# Patient Record
Sex: Male | Born: 1965
Health system: Southern US, Community
[De-identification: ages and names within clinical notes are randomized; demographics above are authoritative.]

## PROBLEM LIST (undated history)

## (undated) DIAGNOSIS — K219 Gastro-esophageal reflux disease without esophagitis: Secondary | ICD-10-CM

## (undated) DIAGNOSIS — D571 Sickle-cell disease without crisis: Secondary | ICD-10-CM

## (undated) DIAGNOSIS — T7840XA Allergy, unspecified, initial encounter: Secondary | ICD-10-CM

## (undated) DIAGNOSIS — B019 Varicella without complication: Secondary | ICD-10-CM

## (undated) HISTORY — DX: Varicella without complication: B01.9

## (undated) HISTORY — DX: Allergy, unspecified, initial encounter: T78.40XA

## (undated) HISTORY — PX: WISDOM TOOTH EXTRACTION: SHX21

---
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
## 2015-11-14 ENCOUNTER — Ambulatory Visit (INDEPENDENT_AMBULATORY_CARE_PROVIDER_SITE_OTHER): Payer: BLUE CROSS/BLUE SHIELD | Admitting: Internal Medicine

## 2015-11-14 ENCOUNTER — Encounter: Payer: Self-pay | Admitting: Internal Medicine

## 2015-11-14 VITALS — BP 138/82 | HR 84 | Temp 98.2°F | Ht 67.25 in | Wt 211.5 lb

## 2015-11-14 DIAGNOSIS — Z0000 Encounter for general adult medical examination without abnormal findings: Secondary | ICD-10-CM | POA: Diagnosis not present

## 2015-11-14 DIAGNOSIS — Z23 Encounter for immunization: Secondary | ICD-10-CM | POA: Diagnosis not present

## 2015-11-14 DIAGNOSIS — R7989 Other specified abnormal findings of blood chemistry: Secondary | ICD-10-CM

## 2015-11-14 NOTE — Progress Notes (Signed)
Pre visit review using our clinic review tool, if applicable. No additional management support is needed unless otherwise documented below in the visit note. 

## 2015-11-14 NOTE — Progress Notes (Signed)
HPI  Pt presents to the clinic today to establish care. He has not had a PCP in many years.  He would like his physical exam today if he can get it.  Flu: 09/2015 Tetanus: unsure Vision Screening: yearly Dentist: biannually  Diet: He does eat meat. He consumes fruits and veggies daily. He does eat some fried foods. He drinks mostly water. Exercise: None  Past Medical History  Diagnosis Date  . Chicken pox     No current outpatient prescriptions on file.   No current facility-administered medications for this visit.    No Known Allergies  Family History  Problem Relation Age of Onset  . Breast cancer Mother   . Hypertension Mother   . Colon cancer Maternal Uncle   . Breast cancer Maternal Grandmother   . Arthritis Paternal Grandmother   . Hypertension Paternal Grandmother     Social History   Social History  . Marital Status: Married    Spouse Name: N/A  . Number of Children: N/A  . Years of Education: N/A   Occupational History  . Not on file.   Social History Main Topics  . Smoking status: Never Smoker   . Smokeless tobacco: Never Used  . Alcohol Use: 0.0 oz/week    0 Standard drinks or equivalent per week     Comment: occasional-social  . Drug Use: No  . Sexual Activity: Not on file   Other Topics Concern  . Not on file   Social History Narrative  . No narrative on file    ROS:  Constitutional: Denies fever, malaise, fatigue, headache or abrupt weight changes.  HEENT: Denies eye pain, eye redness, ear pain, ringing in the ears, wax buildup, runny nose, nasal congestion, bloody nose, or sore throat. Respiratory: Denies difficulty breathing, shortness of breath, cough or sputum production.   Cardiovascular: Denies chest pain, chest tightness, palpitations or swelling in the hands or feet.  Gastrointestinal: Pt reports external hemorrhoids. Denies abdominal pain, bloating, constipation, diarrhea or blood in the stool.  GU: Denies frequency, urgency,  pain with urination, blood in urine, odor or discharge. Musculoskeletal: Denies decrease in range of motion, difficulty with gait, muscle pain or joint pain and swelling.  Skin: Denies redness, rashes, lesions or ulcercations.  Neurological: Denies dizziness, difficulty with memory, difficulty with speech or problems with balance and coordination.  Psych: Denies anxiety, depression, SI/HI.  No other specific complaints in a complete review of systems (except as listed in HPI above).  PE:  BP 138/82 mmHg  Pulse 84  Temp(Src) 98.2 F (36.8 C) (Oral)  Ht 5' 7.25" (1.708 m)  Wt 211 lb 8 oz (95.936 kg)  BMI 32.89 kg/m2  SpO2 98% Wt Readings from Last 3 Encounters:  11/14/15 211 lb 8 oz (95.936 kg)    General: Appears his stated age, obese in NAD. HEENT: Head: normal shape and size; Eyes: sclera white, no icterus, conjunctiva pink, PERRLA and EOMs intact; Ears: Tm's gray and intact, normal light reflex;Throat/Mouth: Teeth present, mucosa pink and moist, no lesions or ulcerations noted.  Neck: Neck supple, trachea midline. No masses, lumps or thyromegaly present.  Cardiovascular: Normal rate and rhythm. S1,S2 noted.  No murmur, rubs or gallops noted. No BLE edema. No carotid bruits noted. Pulmonary/Chest: Normal effort and positive vesicular breath sounds. No respiratory distress. No wheezes, rales or ronchi noted.  Abdomen: Soft and nontender. Normal bowel sounds. No distention or masses noted. Liver, spleen and kidneys non palpable. Musculoskeletal: Normal range of motion. Strength  5/5 BUE/BLE. No signs of joint swelling. No difficulty with gait.  Neurological: Alert and oriented.Cranial nerves II-XII grossly intact. Coordination normal.  Psychiatric: Mood and affect normal. Behavior is normal. Judgment and thought content normal.     Assessment and Plan:  Preventative Health Maintenance:  Flu shot UTD Tdap today Encouraged him to see a eye doctor and dentist annually Will check  CBC, CMET, Lipid, A1C and HIV today Encouraged him to consume a balanced diet and start an exercise regimen  RTC in 1 year or sooner if needed

## 2015-11-14 NOTE — Addendum Note (Signed)
Addended by: Lurlean Nanny on: 11/14/2015 04:38 PM   Modules accepted: Orders

## 2015-11-14 NOTE — Patient Instructions (Signed)

## 2015-11-15 LAB — COMPREHENSIVE METABOLIC PANEL
ALBUMIN: 4.5 g/dL (ref 3.5–5.2)
ALT: 40 U/L (ref 0–53)
AST: 32 U/L (ref 0–37)
Alkaline Phosphatase: 76 U/L (ref 39–117)
BILIRUBIN TOTAL: 0.3 mg/dL (ref 0.2–1.2)
BUN: 15 mg/dL (ref 6–23)
CALCIUM: 9.5 mg/dL (ref 8.4–10.5)
CO2: 26 meq/L (ref 19–32)
CREATININE: 1.42 mg/dL (ref 0.40–1.50)
Chloride: 105 mEq/L (ref 96–112)
GFR: 68.27 mL/min (ref >60.00)
Glucose, Bld: 83 mg/dL (ref 70–99)
Potassium: 4.1 mEq/L (ref 3.5–5.1)
Sodium: 140 mEq/L (ref 135–145)
Total Protein: 7.4 g/dL (ref 6.0–8.3)

## 2015-11-15 LAB — LDL CHOLESTEROL, DIRECT: LDL DIRECT: 108 mg/dL

## 2015-11-15 LAB — CBC
HCT: 43.7 % (ref 39.0–52.0)
HEMOGLOBIN: 14.6 g/dL (ref 13.0–17.0)
MCHC: 33.3 g/dL (ref 30.0–36.0)
MCV: 95.7 fl (ref 78.0–100.0)
PLATELETS: 238 10*3/uL (ref 150.0–400.0)
RBC: 4.57 Mil/uL (ref 4.22–5.81)
RDW: 12.6 % (ref 11.5–15.5)
WBC: 7.8 10*3/uL (ref 4.0–10.5)

## 2015-11-15 LAB — LIPID PANEL
CHOLESTEROL: 182 mg/dL (ref 0–200)
HDL: 47.1 mg/dL (ref >39.00)
NonHDL: 135.12
TRIGLYCERIDES: 216 mg/dL — AB (ref 0.0–149.0)
Total CHOL/HDL Ratio: 4
VLDL: 43.2 mg/dL — ABNORMAL HIGH (ref 0.0–40.0)

## 2015-11-15 LAB — HIV ANTIBODY (ROUTINE TESTING W REFLEX): HIV 1&2 Ab, 4th Generation: NONREACTIVE

## 2015-11-15 LAB — HEMOGLOBIN A1C: Hgb A1c MFr Bld: 4.6 % (ref 4.6–6.5)

## 2016-11-26 ENCOUNTER — Ambulatory Visit (INDEPENDENT_AMBULATORY_CARE_PROVIDER_SITE_OTHER): Payer: BLUE CROSS/BLUE SHIELD | Admitting: Internal Medicine

## 2016-11-26 ENCOUNTER — Encounter: Payer: Self-pay | Admitting: Internal Medicine

## 2016-11-26 VITALS — BP 128/84 | HR 79 | Temp 98.6°F | Ht 67.0 in | Wt 216.0 lb

## 2016-11-26 DIAGNOSIS — Z0000 Encounter for general adult medical examination without abnormal findings: Secondary | ICD-10-CM

## 2016-11-26 DIAGNOSIS — Z1211 Encounter for screening for malignant neoplasm of colon: Secondary | ICD-10-CM | POA: Diagnosis not present

## 2016-11-26 DIAGNOSIS — K219 Gastro-esophageal reflux disease without esophagitis: Secondary | ICD-10-CM | POA: Diagnosis not present

## 2016-11-26 LAB — COMPREHENSIVE METABOLIC PANEL
ALBUMIN: 4.7 g/dL (ref 3.5–5.2)
ALK PHOS: 77 U/L (ref 39–117)
ALT: 45 U/L (ref 0–53)
AST: 31 U/L (ref 0–37)
BILIRUBIN TOTAL: 1 mg/dL (ref 0.2–1.2)
BUN: 14 mg/dL (ref 6–23)
CO2: 30 mEq/L (ref 19–32)
Calcium: 9.7 mg/dL (ref 8.4–10.5)
Chloride: 104 mEq/L (ref 96–112)
Creatinine, Ser: 1.28 mg/dL (ref 0.40–1.50)
GFR: 76.63 mL/min (ref >60.00)
GLUCOSE: 87 mg/dL (ref 70–99)
Potassium: 3.9 mEq/L (ref 3.5–5.1)
Sodium: 140 mEq/L (ref 135–145)
TOTAL PROTEIN: 8.1 g/dL (ref 6.0–8.3)

## 2016-11-26 LAB — CBC
HCT: 44.6 % (ref 39.0–52.0)
HEMOGLOBIN: 15.3 g/dL (ref 13.0–17.0)
MCHC: 34.4 g/dL (ref 30.0–36.0)
MCV: 94.5 fl (ref 78.0–100.0)
Platelets: 239 10*3/uL (ref 150.0–400.0)
RBC: 4.72 Mil/uL (ref 4.22–5.81)
RDW: 13 % (ref 11.5–15.5)
WBC: 7.2 10*3/uL (ref 4.0–10.5)

## 2016-11-26 LAB — LIPID PANEL
CHOLESTEROL: 216 mg/dL — AB (ref 0–200)
HDL: 56.5 mg/dL (ref >39.00)
LDL Cholesterol: 138 mg/dL — ABNORMAL HIGH (ref 0–99)
NONHDL: 159.39
Total CHOL/HDL Ratio: 4
Triglycerides: 105 mg/dL (ref 0.0–149.0)
VLDL: 21 mg/dL (ref 0.0–40.0)

## 2016-11-26 MED ORDER — OMEPRAZOLE 20 MG PO CPDR: 20.0000 mg | DELAYED_RELEASE_CAPSULE | Freq: Every day | ORAL | 3 refills | Status: DC

## 2016-11-26 NOTE — Patient Instructions (Signed)

## 2016-11-26 NOTE — Assessment & Plan Note (Signed)
Triggered by tomato based and spicy food eRx for Prilosec OTC

## 2016-11-26 NOTE — Progress Notes (Signed)
Subjective:    Patient ID: Victor Carter, male    DOB: Oct 16, 1966, 50 y.o.   MRN: [id number]  HPI  Pt presents to the clinic today for his annual exam.  Flu: 09/2016 Tetanus: 11/2015 Vision Screening: yearly Dentist: biannually  Diet: He does eat meat. He consumes fruits and veggies most days. He does eat some fried foods. He drinks mostly water, some soda or beer. Exercise: None  Review of Systems  Past Medical History:  Diagnosis Date  . Chicken pox     No current outpatient prescriptions on file.   No current facility-administered medications for this visit.     No Known Allergies  Family History  Problem Relation Age of Onset  . Breast cancer Mother   . Hypertension Mother   . Colon cancer Maternal Uncle   . Breast cancer Maternal Grandmother   . Arthritis Paternal Grandmother   . Hypertension Paternal Grandmother   . Diabetes Neg Hx   . Stroke Neg Hx     Social History   Social History  . Marital status: Married    Spouse name: N/A  . Number of children: N/A  . Years of education: N/A   Occupational History  . Not on file.   Social History Main Topics  . Smoking status: Never Smoker  . Smokeless tobacco: Never Used  . Alcohol use 0.0 oz/week     Comment: occasional-social  . Drug use: No  . Sexual activity: Yes   Other Topics Concern  . Not on file   Social History Narrative  . No narrative on file     Constitutional: Denies fever, malaise, fatigue, headache or abrupt weight changes.  HEENT: Denies eye pain, eye redness, ear pain, ringing in the ears, wax buildup, runny nose, nasal congestion, bloody nose, or sore throat. Respiratory: Denies difficulty breathing, shortness of breath, cough or sputum production.   Cardiovascular: Denies chest pain, chest tightness, palpitations or swelling in the hands or feet.  Gastrointestinal: Pt reports reflux. Denies abdominal pain, bloating, constipation, diarrhea or blood in the stool.  GU:  Denies urgency, frequency, pain with urination, burning sensation, blood in urine, odor or discharge. Musculoskeletal: Denies decrease in range of motion, difficulty with gait, muscle pain or joint pain and swelling.  Skin: Denies redness, rashes, lesions or ulcercations.  Neurological: Denies dizziness, difficulty with memory, difficulty with speech or problems with balance and coordination.  Psych: Denies anxiety, depression, SI/HI.  No other specific complaints in a complete review of systems (except as listed in HPI above).     Objective:   Physical Exam   BP 128/84   Pulse 79   Temp 98.6 F (37 C) (Oral)   Ht 5\' 7"  (1.702 m)   Wt 216 lb (98 kg) Comment: w/ boots  SpO2 98%   BMI 33.83 kg/m  Wt Readings from Last 3 Encounters:  11/26/16 216 lb (98 kg)  11/14/15 211 lb 8 oz (95.9 kg)    General: Appears his stated age, obese in NAD. Skin: Warm, dry and intact.  HEENT: Head: normal shape and size; Eyes: sclera white, no icterus, conjunctiva pink, PERRLA and EOMs intact; Ears: Tm's gray and intact, normal light reflex; Throat/Mouth: Teeth present, mucosa pink and moist, no exudate, lesions or ulcerations noted.  Neck:  Neck supple, trachea midline. No masses, lumps or thyromegaly present.  Cardiovascular: Normal rate and rhythm. S1,S2 noted.  No murmur, rubs or gallops noted. No JVD or BLE edema. No carotid bruits noted. Pulmonary/Chest:  Normal effort and positive vesicular breath sounds. No respiratory distress. No wheezes, rales or ronchi noted.  Abdomen: Soft and nontender. Normal bowel sounds. No distention or masses noted. Liver, spleen and kidneys non palpable. Musculoskeletal: Normal range of motion. Strength 5/5 BUE/BLE. No difficulty with gait.  Neurological: Alert and oriented. Cranial nerves II-XII grossly intact. Coordination normal.  Psychiatric: Mood and affect normal. Behavior is normal. Judgment and thought content normal.     BMET    Component Value  Date/Time   NA 140 11/14/2015 1545   K 4.1 11/14/2015 1545   CL 105 11/14/2015 1545   CO2 26 11/14/2015 1545   GLUCOSE 83 11/14/2015 1545   BUN 15 11/14/2015 1545   CREATININE 1.42 11/14/2015 1545   CALCIUM 9.5 11/14/2015 1545    Lipid Panel     Component Value Date/Time   CHOL 182 11/14/2015 1545   TRIG 216.0 (H) 11/14/2015 1545   HDL 47.10 11/14/2015 1545   CHOLHDL 4 11/14/2015 1545   VLDL 43.2 (H) 11/14/2015 1545    CBC    Component Value Date/Time   WBC 7.8 11/14/2015 1545   RBC 4.57 11/14/2015 1545   HGB 14.6 11/14/2015 1545   HCT 43.7 11/14/2015 1545   PLT 238.0 11/14/2015 1545   MCV 95.7 11/14/2015 1545   MCHC 33.3 11/14/2015 1545   RDW 12.6 11/14/2015 1545    Hgb A1C Lab Results  Component Value Date   HGBA1C 4.6 11/14/2015           Assessment & Plan:   Preventative Health Maintenance:  Flu and tetanus UTD Referral placed to GI for screening colonoscopy Encouraged him to consume a balanced diet and exercise regimen Advised him to see an eye doctor and dentist annaully Will check CBC, CMET, Lipid profile today  RTC in 1 year, sooner if needed Webb Silversmith, NP

## 2016-12-23 ENCOUNTER — Other Ambulatory Visit: Payer: Self-pay

## 2016-12-23 ENCOUNTER — Telehealth: Payer: Self-pay

## 2016-12-23 NOTE — Telephone Encounter (Signed)
Gastroenterology Pre-Procedure Review  Request Date:  Requesting Physician: Dr.   PATIENT REVIEW QUESTIONS: The patient responded to the following health history questions as indicated:    1. Are you having any GI issues? no 2. Do you have a personal history of Polyps? no 3. Do you have a family history of Colon Cancer or Polyps? yes (Aunts and uncles) 4. Diabetes Mellitus? no 5. Joint replacements in the past 12 months?no 6. Major health problems in the past 3 months?no 7. Any artificial heart valves, MVP, or defibrillator?no    MEDICATIONS & ALLERGIES:    Patient reports the following regarding taking any anticoagulation/antiplatelet therapy:   Plavix, Coumadin, Eliquis, Xarelto, Lovenox, Pradaxa, Brilinta, or Effient? no Aspirin? no  Patient confirms/reports the following medications:  Current Outpatient Prescriptions  Medication Sig Dispense Refill  . omeprazole (PRILOSEC) 20 MG capsule Take 1 capsule (20 mg total) by mouth daily. 30 capsule 3   No current facility-administered medications for this visit.     Patient confirms/reports the following allergies:  No Known Allergies  No orders of the defined types were placed in this encounter.   AUTHORIZATION INFORMATION Primary Insurance: 1D#: Group #:  Secondary Insurance: 1D#: Group #:  SCHEDULE INFORMATION: Date: 01/15/17 Time: Location: [facility]

## 2017-01-14 ENCOUNTER — Encounter: Payer: Self-pay | Admitting: *Deleted

## 2017-01-15 ENCOUNTER — Ambulatory Visit: Payer: BLUE CROSS/BLUE SHIELD | Admitting: Anesthesiology

## 2017-01-15 ENCOUNTER — Encounter: Payer: Self-pay | Admitting: *Deleted

## 2017-01-15 ENCOUNTER — Encounter
Admission: RE | Disposition: A | Discharge status: Home / Self Care | Payer: Self-pay | Source: Ambulatory Visit | Attending: Gastroenterology

## 2017-01-15 ENCOUNTER — Ambulatory Visit
Admission: RE | Admit: 2017-01-15 | Discharge: 2017-01-15 | Disposition: A | Discharge status: Home / Self Care | Payer: BLUE CROSS/BLUE SHIELD | Source: Ambulatory Visit | Attending: Gastroenterology | Admitting: Gastroenterology

## 2017-01-15 DIAGNOSIS — K640 First degree hemorrhoids: Secondary | ICD-10-CM | POA: Diagnosis not present

## 2017-01-15 DIAGNOSIS — Z800 Family history of malignant neoplasm of digestive organs: Secondary | ICD-10-CM | POA: Insufficient documentation

## 2017-01-15 DIAGNOSIS — K649 Unspecified hemorrhoids: Secondary | ICD-10-CM | POA: Diagnosis not present

## 2017-01-15 DIAGNOSIS — Z8371 Family history of colonic polyps: Secondary | ICD-10-CM | POA: Insufficient documentation

## 2017-01-15 DIAGNOSIS — K219 Gastro-esophageal reflux disease without esophagitis: Secondary | ICD-10-CM | POA: Diagnosis not present

## 2017-01-15 DIAGNOSIS — Z9889 Other specified postprocedural states: Secondary | ICD-10-CM | POA: Insufficient documentation

## 2017-01-15 DIAGNOSIS — Z79899 Other long term (current) drug therapy: Secondary | ICD-10-CM | POA: Diagnosis not present

## 2017-01-15 DIAGNOSIS — D123 Benign neoplasm of transverse colon: Secondary | ICD-10-CM | POA: Diagnosis not present

## 2017-01-15 DIAGNOSIS — K635 Polyp of colon: Secondary | ICD-10-CM | POA: Diagnosis not present

## 2017-01-15 DIAGNOSIS — D125 Benign neoplasm of sigmoid colon: Secondary | ICD-10-CM | POA: Diagnosis not present

## 2017-01-15 DIAGNOSIS — Z8249 Family history of ischemic heart disease and other diseases of the circulatory system: Secondary | ICD-10-CM | POA: Insufficient documentation

## 2017-01-15 DIAGNOSIS — Z1211 Encounter for screening for malignant neoplasm of colon: Secondary | ICD-10-CM | POA: Insufficient documentation

## 2017-01-15 DIAGNOSIS — Z803 Family history of malignant neoplasm of breast: Secondary | ICD-10-CM | POA: Diagnosis not present

## 2017-01-15 DIAGNOSIS — Z8619 Personal history of other infectious and parasitic diseases: Secondary | ICD-10-CM | POA: Diagnosis not present

## 2017-01-15 DIAGNOSIS — Z8261 Family history of arthritis: Secondary | ICD-10-CM | POA: Insufficient documentation

## 2017-01-15 HISTORY — DX: Gastro-esophageal reflux disease without esophagitis: K21.9

## 2017-01-15 HISTORY — PX: COLONOSCOPY WITH PROPOFOL: SHX5780

## 2017-01-15 SURGERY — COLONOSCOPY WITH PROPOFOL
Anesthesia: General

## 2017-01-15 MED ORDER — MIDAZOLAM HCL 5 MG/5ML IJ SOLN
INTRAMUSCULAR | Status: DC | PRN
  Administered 2017-01-15: 1 mg via INTRAVENOUS

## 2017-01-15 MED ORDER — SODIUM CHLORIDE 0.9 % IV SOLN
INTRAVENOUS | Status: DC
  Administered 2017-01-15: 08:00:00 via INTRAVENOUS

## 2017-01-15 MED ORDER — FENTANYL CITRATE (PF) 100 MCG/2ML IJ SOLN
INTRAMUSCULAR | Status: AC
  Filled 2017-01-15: qty 2

## 2017-01-15 MED ORDER — PROPOFOL 500 MG/50ML IV EMUL
INTRAVENOUS | Status: DC | PRN
  Administered 2017-01-15: 120 ug/kg/min via INTRAVENOUS

## 2017-01-15 MED ORDER — PROPOFOL 10 MG/ML IV BOLUS
INTRAVENOUS | Status: DC | PRN
  Administered 2017-01-15: 46.1 mg via INTRAVENOUS

## 2017-01-15 MED ORDER — MIDAZOLAM HCL 2 MG/2ML IJ SOLN
INTRAMUSCULAR | Status: AC
  Filled 2017-01-15: qty 2

## 2017-01-15 MED ORDER — FENTANYL CITRATE (PF) 100 MCG/2ML IJ SOLN
INTRAMUSCULAR | Status: DC | PRN
  Administered 2017-01-15: 50 ug via INTRAVENOUS

## 2017-01-15 MED ORDER — LIDOCAINE HCL (PF) 2 % IJ SOLN
INTRAMUSCULAR | Status: AC
  Filled 2017-01-15: qty 2

## 2017-01-15 MED ORDER — PROPOFOL 500 MG/50ML IV EMUL
INTRAVENOUS | Status: AC
  Filled 2017-01-15: qty 50

## 2017-01-15 NOTE — Anesthesia Postprocedure Evaluation (Signed)
Anesthesia Post Note  Patient: Victor Carter  Procedure(s) Performed: Procedure(s) (LRB): COLONOSCOPY WITH PROPOFOL (N/A)  Patient location during evaluation: Endoscopy Anesthesia Type: General Level of consciousness: awake and alert Pain management: pain level controlled Vital Signs Assessment: post-procedure vital signs reviewed and stable Respiratory status: spontaneous breathing and respiratory function stable Cardiovascular status: stable Anesthetic complications: no     Last Vitals:  Vitals:   01/15/17 0730 01/15/17 0817  BP: 129/88   Pulse: 74   Resp: 17   Temp: 36.4 C (!) 35.8 C    Last Pain:  Vitals:   01/15/17 0817  TempSrc: Tympanic                 Keaten Mashek K

## 2017-01-15 NOTE — H&P (Signed)
  Jonathon Bellows MD [address], [place],  [address] Phone: [phone] Fax : [phone]  Primary Care Physician:  Webb Silversmith, NP Primary Gastroenterologist:  Dr. Jonathon Bellows   Pre-Procedure History & Physical: HPI:  Victor Carter is a 51 y.o. male is here for an colonoscopy.   Past Medical History:  Diagnosis Date  . Chicken pox   . GERD (gastroesophageal reflux disease)     Past Surgical History:  Procedure Laterality Date  . LEG TENDON SURGERY Left 1986  . WISDOM TOOTH EXTRACTION      Prior to Admission medications   Medication Sig Start Date End Date Taking? Authorizing Provider  omeprazole (PRILOSEC) 20 MG capsule Take 1 capsule (20 mg total) by mouth daily. 11/26/16  Yes Jearld Fenton, NP    Allergies as of 12/23/2016  . (No Known Allergies)    Family History  Problem Relation Age of Onset  . Breast cancer Mother   . Hypertension Mother   . Breast cancer Maternal Grandmother   . Colon cancer Maternal Uncle   . Arthritis Paternal Grandmother   . Hypertension Paternal Grandmother   . Diabetes Neg Hx   . Stroke Neg Hx     Social History   Social History  . Marital status: Married    Spouse name: N/A  . Number of children: N/A  . Years of education: N/A   Occupational History  . Not on file.   Social History Main Topics  . Smoking status: Never Smoker  . Smokeless tobacco: Never Used  . Alcohol use 0.0 oz/week     Comment: occasional-social  . Drug use: No  . Sexual activity: Yes   Other Topics Concern  . Not on file   Social History Narrative  . No narrative on file    Review of Systems: See HPI, otherwise negative ROS  Physical Exam: BP 129/88   Pulse 74   Temp 97.6 F (36.4 C) (Tympanic)   Resp 17   Ht 5\' 7"  (1.702 m)   Wt 203 lb (92.1 kg)   BMI 31.79 kg/m  General:   Alert,  pleasant and cooperative in NAD Head:  Normocephalic and atraumatic. Neck:  Supple; no masses or thyromegaly. Lungs:  Clear  throughout to auscultation.    Heart:  Regular rate and rhythm. Abdomen:  Soft, nontender and nondistended. Normal bowel sounds, without guarding, and without rebound.   Neurologic:  Alert and  oriented x4;  grossly normal neurologically.  Impression/Plan: Victor Carter is here for an colonoscopy to be performed for colorectal cancer screening- father had colon polyps  Risks, benefits, limitations, and alternatives regarding  colonoscopy have been reviewed with the patient.  Questions have been answered.  All parties agreeable.   Jonathon Bellows, MD  01/15/2017, 7:50 AM

## 2017-01-15 NOTE — Op Note (Signed)
[facility] Gastroenterology Patient Name: Victor Carter Procedure Date: 01/15/2017 7:52 AM MRN: [id number] Account #: [id number] Date of Birth: [birth year] Admit Type: Outpatient Age: 51 Room: [facility] ENDO ROOM 1 Gender: Male Note Status: Finalized Procedure:            Colonoscopy Indications:          Colon cancer screening in patient at increased risk:                        Family history of 1st-degree relative with colon polyps Providers:            Jonathon Bellows MD, MD Referring MD:         Jearld Fenton (Referring MD) Medicines:            Monitored Anesthesia Care Complications:        No immediate complications. Procedure:            Pre-Anesthesia Assessment:                       - Prior to the procedure, a History and Physical was                        performed, and patient medications, allergies and                        sensitivities were reviewed. The patient's tolerance of                        previous anesthesia was reviewed.                       - The risks and benefits of the procedure and the                        sedation options and risks were discussed with the                        patient. All questions were answered and informed                        consent was obtained.                       - The risks and benefits of the procedure and the                        sedation options and risks were discussed with the                        patient. All questions were answered and informed                        consent was obtained.                       - ASA Grade Assessment: II - A patient with mild                        systemic disease.  After obtaining informed consent, the colonoscope was                        passed under direct vision. Throughout the procedure,                        the patient's blood pressure, pulse, and oxygen                        saturations were monitored continuously. The                      Colonoscope was introduced through the anus and                        advanced to the the cecum, identified by the                        appendiceal orifice, IC valve and transillumination.                        The colonoscopy was performed with ease. The patient                        tolerated the procedure well. The quality of the bowel                        preparation was excellent. Findings:      The perianal and digital rectal examinations were normal.      Non-bleeding internal hemorrhoids were found during retroflexion. The       hemorrhoids were small and Grade I (internal hemorrhoids that do not       prolapse).      Three sessile polyps were found in the transverse colon. The polyps were       4 to 6 mm in size. These polyps were removed with a cold snare.       Resection and retrieval were complete.      A 5 mm polyp was found in the sigmoid colon. The polyp was sessile. The       polyp was removed with a cold snare. Resection and retrieval were       complete. Impression:           - Non-bleeding internal hemorrhoids.                       - Three 4 to 6 mm polyps in the transverse colon,                        removed with a cold snare. Resected and retrieved.                       - One 5 mm polyp in the sigmoid colon, removed with a                        cold snare. Resected and retrieved. Recommendation:       - Discharge patient to home (with escort).                       - Resume previous diet.                       -  Continue present medications.                       - Await pathology results.                       - Repeat colonoscopy in 3 - 5 years for surveillance of                        multiple polyps. Procedure Code(s):    --- Professional ---                       [phone], Colonoscopy, flexible; with removal of tumor(s),                        polyp(s), or other lesion(s) by snare technique Diagnosis Code(s):    --- Professional  ---                       Z83.71, Family history of colonic polyps                       D12.3, Benign neoplasm of transverse colon (hepatic                        flexure or splenic flexure)                       D12.5, Benign neoplasm of sigmoid colon                       K64.0, First degree hemorrhoids CPT copyright 2016 American Medical [employer]. All rights reserved. The codes documented in this report are preliminary and upon coder review may  be revised to meet current compliance requirements. Jonathon Bellows, MD Jonathon Bellows MD, MD 01/15/2017 8:16:56 AM This report has been signed electronically. Number of Addenda: 0 Note Initiated On: 01/15/2017 7:52 AM Scope Withdrawal Time: 0 hours 9 minutes 43 seconds  Total Procedure Duration: 0 hours 14 minutes 10 seconds       [facility]

## 2017-01-15 NOTE — Transfer of Care (Signed)
Immediate Anesthesia Transfer of Care Note  Patient: Victor Carter  Procedure(s) Performed: Procedure(s): COLONOSCOPY WITH PROPOFOL (N/A)  Patient Location: PACU and Endoscopy Unit  Anesthesia Type:General  Level of Consciousness: sedated  Airway & Oxygen Therapy: Patient Spontanous Breathing and Patient connected to nasal cannula oxygen  Post-op Assessment: Report given to RN and Post -op Vital signs reviewed and stable  Post vital signs: stable  Last Vitals:  Vitals:   01/15/17 0730 01/15/17 0817  BP: 129/88   Pulse: 74   Resp: 17   Temp: 36.4 C (!) 35.8 C    Last Pain:  Vitals:   01/15/17 0817  TempSrc: Tympanic         Complications: No apparent anesthesia complications

## 2017-01-15 NOTE — Anesthesia Post-op Follow-up Note (Signed)
Anesthesia QCDR form completed.        

## 2017-01-15 NOTE — Anesthesia Preprocedure Evaluation (Signed)
Anesthesia Evaluation  Patient identified by MRN, date of birth, ID band Patient awake    Reviewed: Allergy & Precautions, NPO status , Patient's Chart, lab work & pertinent test results  History of Anesthesia Complications Negative for: history of anesthetic complications  Airway Mallampati: III       Dental   Pulmonary neg pulmonary ROS,           Cardiovascular negative cardio ROS       Neuro/Psych negative neurological ROS     GI/Hepatic Neg liver ROS, GERD  Medicated and Controlled,  Endo/Other  negative endocrine ROS  Renal/GU negative Renal ROS     Musculoskeletal   Abdominal   Peds  Hematology negative hematology ROS (+)   Anesthesia Other Findings   Reproductive/Obstetrics                             Anesthesia Physical Anesthesia Plan  ASA: II  Anesthesia Plan: General   Post-op Pain Management:    Induction: Intravenous  Airway Management Planned: Nasal Cannula  Additional Equipment:   Intra-op Plan:   Post-operative Plan:   Informed Consent: I have reviewed the patients History and Physical, chart, labs and discussed the procedure including the risks, benefits and alternatives for the proposed anesthesia with the patient or authorized representative who has indicated his/her understanding and acceptance.     Plan Discussed with:   Anesthesia Plan Comments:         Anesthesia Quick Evaluation

## 2017-01-18 ENCOUNTER — Encounter: Payer: Self-pay | Admitting: Gastroenterology

## 2017-01-18 LAB — SURGICAL PATHOLOGY

## 2017-02-09 ENCOUNTER — Other Ambulatory Visit: Payer: Self-pay

## 2017-02-09 DIAGNOSIS — K219 Gastro-esophageal reflux disease without esophagitis: Secondary | ICD-10-CM

## 2017-02-09 MED ORDER — OMEPRAZOLE 20 MG PO CPDR: 20.0000 mg | DELAYED_RELEASE_CAPSULE | Freq: Every day | ORAL | 2 refills | Status: DC

## 2017-02-09 NOTE — Telephone Encounter (Signed)
Pt request [facility]ills on [facility]; pt said has [facility]. Annual on 11/26/16. Advised pt refill done.

## 2017-08-04 DIAGNOSIS — J019 Acute sinusitis, unspecified: Secondary | ICD-10-CM | POA: Diagnosis not present

## 2017-08-04 DIAGNOSIS — J209 Acute bronchitis, unspecified: Secondary | ICD-10-CM | POA: Diagnosis not present

## 2017-08-04 DIAGNOSIS — B9689 Other specified bacterial agents as the cause of diseases classified elsewhere: Secondary | ICD-10-CM | POA: Diagnosis not present

## 2018-01-06 ENCOUNTER — Other Ambulatory Visit: Payer: BLUE CROSS/BLUE SHIELD

## 2018-01-12 ENCOUNTER — Encounter: Payer: Self-pay | Admitting: Internal Medicine

## 2018-01-12 ENCOUNTER — Ambulatory Visit (INDEPENDENT_AMBULATORY_CARE_PROVIDER_SITE_OTHER): Payer: BLUE CROSS/BLUE SHIELD | Admitting: Internal Medicine

## 2018-01-12 VITALS — BP 124/86 | HR 99 | Temp 98.2°F | Wt 225.0 lb

## 2018-01-12 DIAGNOSIS — J3089 Other allergic rhinitis: Secondary | ICD-10-CM

## 2018-01-12 DIAGNOSIS — K219 Gastro-esophageal reflux disease without esophagitis: Secondary | ICD-10-CM | POA: Diagnosis not present

## 2018-01-12 DIAGNOSIS — Z125 Encounter for screening for malignant neoplasm of prostate: Secondary | ICD-10-CM

## 2018-01-12 DIAGNOSIS — N529 Male erectile dysfunction, unspecified: Secondary | ICD-10-CM | POA: Insufficient documentation

## 2018-01-12 DIAGNOSIS — Z0001 Encounter for general adult medical examination with abnormal findings: Secondary | ICD-10-CM | POA: Diagnosis not present

## 2018-01-12 DIAGNOSIS — Z131 Encounter for screening for diabetes mellitus: Secondary | ICD-10-CM

## 2018-01-12 DIAGNOSIS — Z13220 Encounter for screening for lipoid disorders: Secondary | ICD-10-CM

## 2018-01-12 MED ORDER — OMEPRAZOLE 20 MG PO CPDR: 20.0000 mg | DELAYED_RELEASE_CAPSULE | Freq: Every day | ORAL | 3 refills | Status: DC

## 2018-01-12 MED ORDER — CETIRIZINE HCL 10 MG PO TABS: 10.0000 mg | ORAL_TABLET | Freq: Every day | ORAL | 11 refills | Status: DC

## 2018-01-12 MED ORDER — FLUTICASONE PROPIONATE 50 MCG/ACT NA SUSP: 2.0000 | Freq: Every day | NASAL | 6 refills | Status: DC

## 2018-01-12 MED ORDER — SILDENAFIL CITRATE 25 MG PO TABS: 25.0000 mg | ORAL_TABLET | Freq: Every day | ORAL | 5 refills | Status: DC | PRN

## 2018-01-12 NOTE — Assessment & Plan Note (Signed)
Controlled on Omeprazole, refilled today Encouraged weight loss CBC and CMET today

## 2018-01-12 NOTE — Patient Instructions (Signed)

## 2018-01-12 NOTE — Progress Notes (Signed)
Subjective:    Patient ID: Victor Carter, male    DOB: 1965-12-19, 52 y.o.   MRN: [id number]  HPI  Pt presents to the clinic today for his annual exam.   GERD: He denies breakthrough on Omeprazole. He would like a refill of this today.  He also c/o itchy eyes, nasal congestion and scratchy throat. This started 3 days ago. He is blowing clear mucous out of his nose. He denies difficulty swallowing. He has tried Visine Allergy with some relief.   Flu: 09/2017, work Tetanus: 11/2015 PSA Screening: never Colon Screening: 01/2017 Vision Screening: yearly Dentist: biannually  Diet: He does eat meat. He consumes fruits and veggies daily. He does eat fried foods. He drinks mostly water. Exercise: None  Review of Systems  Past Medical History:  Diagnosis Date  . Chicken pox   . GERD (gastroesophageal reflux disease)     Current Outpatient Medications  Medication Sig Dispense Refill  . omeprazole (PRILOSEC) 20 MG capsule Take 1 capsule (20 mg total) by mouth daily. 90 capsule 2   No current facility-administered medications for this visit.     No Known Allergies  Family History  Problem Relation Age of Onset  . Breast cancer Mother   . Hypertension Mother   . Breast cancer Maternal Grandmother   . Colon cancer Maternal Uncle   . Arthritis Paternal Grandmother   . Hypertension Paternal Grandmother   . Diabetes Neg Hx   . Stroke Neg Hx     Social History   Socioeconomic History  . Marital status: Married    Spouse name: Not on file  . Number of children: Not on file  . Years of education: Not on file  . Highest education level: Not on file  Social Needs  . Financial resource strain: Not on file  . Food insecurity - worry: Not on file  . Food insecurity - inability: Not on file  . Transportation needs - medical: Not on file  . Transportation needs - non-medical: Not on file  Occupational History  . Not on file  Tobacco Use  . Smoking status: Never Smoker  .  Smokeless tobacco: Never Used  Substance and Sexual Activity  . Alcohol use: Yes    Alcohol/week: 0.0 oz    Comment: occasional-social  . Drug use: No  . Sexual activity: Yes  Other Topics Concern  . Not on file  Social History Narrative  . Not on file     Constitutional: Pt reports weight gain. Denies fever, malaise, fatigue, headache.  HEENT: Pt reports itchy eyes, nasal congestion and sore throat. Denies eye pain, eye redness, ear pain, ringing in the ears, wax buildup, runny nose, bloody nose. Respiratory: Denies difficulty breathing, shortness of breath, cough or sputum production.   Cardiovascular: Denies chest pain, chest tightness, palpitations or swelling in the hands or feet.  Gastrointestinal: Denies abdominal pain, bloating, constipation, diarrhea or blood in the stool.  GU: Pt reports difficulty maintaining an erection. Denies urgency, frequency, pain with urination, burning sensation, blood in urine, odor or discharge. Musculoskeletal: Denies decrease in range of motion, difficulty with gait, muscle pain or joint pain and swelling.  Skin: Denies redness, rashes, lesions or ulcercations.  Neurological: Denies dizziness, difficulty with memory, difficulty with speech or problems with balance and coordination.  Psych: Denies anxiety, depression, SI/HI.  No other specific complaints in a complete review of systems (except as listed in HPI above).     Objective:   Physical Exam  BP 124/86   Pulse 99   Temp 98.2 F (36.8 C) (Oral)   Wt 225 lb (102.1 kg)   SpO2 97%   BMI 35.24 kg/m  Wt Readings from Last 3 Encounters:  01/12/18 225 lb (102.1 kg)  01/15/17 203 lb (92.1 kg)  11/26/16 216 lb (98 kg)    General: Appears his stated age, obese in NAD. Skin: Warm, dry and intact. No rashes, lesions or ulcerations noted. HEENT: Head: normal shape and size, no sinus tenderness noted; Eyes: sclera white, no icterus, conjunctiva pink, PERRLA and EOMs intact; Ears: Tm's  gray and intact, normal light reflex; Nose: mucosa boggy and moist, turbinates swollen; Throat/Mouth: Teeth present, mucosa pink and moist, no exudate, lesions or ulcerations noted.  Neck:  Neck supple, trachea midline. No masses, lumps or thyromegaly present.  Cardiovascular: Normal rate and rhythm. S1,S2 noted.  No murmur, rubs or gallops noted. No JVD or BLE edema. No carotid bruits noted. Pulmonary/Chest: Normal effort and positive vesicular breath sounds. No respiratory distress. No wheezes, rales or ronchi noted.  Abdomen: Soft and nontender. Normal bowel sounds. Ventral hernia noted. Liver, spleen and kidneys non palpable. Musculoskeletal: Strength 5/5 BUE/BLE. No difficulty with gait.  Neurological: Alert and oriented. Cranial nerves II-XII grossly intact. Coordination normal.  Psychiatric: Mood and affect normal. Behavior is normal. Judgment and thought content normal.    BMET    Component Value Date/Time   NA 140 11/26/2016 1347   K 3.9 11/26/2016 1347   CL 104 11/26/2016 1347   CO2 30 11/26/2016 1347   GLUCOSE 87 11/26/2016 1347   BUN 14 11/26/2016 1347   CREATININE 1.28 11/26/2016 1347   CALCIUM 9.7 11/26/2016 1347    Lipid Panel     Component Value Date/Time   CHOL 216 (H) 11/26/2016 1347   TRIG 105.0 11/26/2016 1347   HDL 56.50 11/26/2016 1347   CHOLHDL 4 11/26/2016 1347   VLDL 21.0 11/26/2016 1347   LDLCALC 138 (H) 11/26/2016 1347    CBC    Component Value Date/Time   WBC 7.2 11/26/2016 1347   RBC 4.72 11/26/2016 1347   HGB 15.3 11/26/2016 1347   HCT 44.6 11/26/2016 1347   PLT 239.0 11/26/2016 1347   MCV 94.5 11/26/2016 1347   MCHC 34.4 11/26/2016 1347   RDW 13.0 11/26/2016 1347    Hgb A1C Lab Results  Component Value Date   HGBA1C 4.6 11/14/2015           Assessment & Plan:   Preventative Health Maintenance:  Flu and tetanus UTD Colon screening UTD Encouraged him to consume a balanced diet and exercise regimen Advised him to see an eye  doctor and dentist annually Will check CBC, CMET, Lipid, A1C and PSA today  Allergic Rhinitis:  eRx for Zyrtec and Flonase OTC- use as directed  RTC in 1 year, sooner if needed Webb Silversmith, NP

## 2018-01-12 NOTE — Assessment & Plan Note (Signed)
Will trial Viagra

## 2018-01-13 LAB — CBC
HCT: 43.8 % (ref 39.0–52.0)
Hemoglobin: 14.5 g/dL (ref 13.0–17.0)
MCHC: 33.2 g/dL (ref 30.0–36.0)
MCV: 97.1 fl (ref 78.0–100.0)
Platelets: 226 10*3/uL (ref 150.0–400.0)
RBC: 4.51 Mil/uL (ref 4.22–5.81)
RDW: 12.2 % (ref 11.5–15.5)
WBC: 5.5 10*3/uL (ref 4.0–10.5)

## 2018-01-13 LAB — COMPREHENSIVE METABOLIC PANEL
ALBUMIN: 4.4 g/dL (ref 3.5–5.2)
ALK PHOS: 76 U/L (ref 39–117)
ALT: 28 U/L (ref 0–53)
AST: 23 U/L (ref 0–37)
BUN: 13 mg/dL (ref 6–23)
CO2: 29 mEq/L (ref 19–32)
CREATININE: 1.3 mg/dL (ref 0.40–1.50)
Calcium: 9.4 mg/dL (ref 8.4–10.5)
Chloride: 102 mEq/L (ref 96–112)
GFR: 74.93 mL/min (ref >60.00)
GLUCOSE: 91 mg/dL (ref 70–99)
Potassium: 4.1 mEq/L (ref 3.5–5.1)
SODIUM: 138 meq/L (ref 135–145)
TOTAL PROTEIN: 7.5 g/dL (ref 6.0–8.3)
Total Bilirubin: 0.7 mg/dL (ref 0.2–1.2)

## 2018-01-13 LAB — LIPID PANEL
CHOLESTEROL: 183 mg/dL (ref 0–200)
HDL: 45.1 mg/dL (ref >39.00)
LDL CALC: 114 mg/dL — AB (ref 0–99)
NonHDL: 137.59
Total CHOL/HDL Ratio: 4
Triglycerides: 120 mg/dL (ref 0.0–149.0)
VLDL: 24 mg/dL (ref 0.0–40.0)

## 2018-01-13 LAB — HEMOGLOBIN A1C: HEMOGLOBIN A1C: 4.6 % (ref 4.6–6.5)

## 2018-01-13 LAB — PSA: PSA: 3.41 ng/mL (ref 0.10–4.00)

## 2018-05-17 ENCOUNTER — Encounter: Payer: Self-pay | Admitting: Internal Medicine

## 2018-05-17 ENCOUNTER — Ambulatory Visit (INDEPENDENT_AMBULATORY_CARE_PROVIDER_SITE_OTHER): Payer: BLUE CROSS/BLUE SHIELD | Admitting: Internal Medicine

## 2018-05-17 VITALS — BP 124/80 | HR 88 | Temp 98.3°F | Wt 223.0 lb

## 2018-05-17 DIAGNOSIS — R310 Gross hematuria: Secondary | ICD-10-CM

## 2018-05-17 DIAGNOSIS — N529 Male erectile dysfunction, unspecified: Secondary | ICD-10-CM

## 2018-05-17 DIAGNOSIS — R300 Dysuria: Secondary | ICD-10-CM

## 2018-05-17 LAB — POC URINALSYSI DIPSTICK (AUTOMATED)
Bilirubin, UA: NEGATIVE
GLUCOSE UA: NEGATIVE
Ketones, UA: NEGATIVE
Leukocytes, UA: NEGATIVE
Nitrite, UA: NEGATIVE
Protein, UA: POSITIVE — AB
SPEC GRAV UA: 1.025 (ref 1.010–1.025)
UROBILINOGEN UA: 0.2 U/dL
pH, UA: 6 (ref 5.0–8.0)

## 2018-05-17 MED ORDER — SILDENAFIL CITRATE 100 MG PO TABS: 100.0000 mg | ORAL_TABLET | Freq: Every day | ORAL | 0 refills | Status: DC | PRN

## 2018-05-17 NOTE — Progress Notes (Signed)
Subjective:    Patient ID: Victor Carter, male    DOB: 07/26/1966, 52 y.o.   MRN: [id number]  HPI  Pt presents to the clinic today with c/o dysuria and blood in his urine. He reports last night, when he got up to use the bathroom, it was painful. When he woke up this morning to urinate, he saw a small amount of blood in his urine, but did not have any dysuria, urgency or frequency. He reports now, he has a little "tingling" while urinating but no pain and no more blood. He denies fever, chills, nausea, flank pain or abdominal pain. He denies penile discharge, irritation or testicular pain. He is sexually active but denies concern for STD. He has no history of kidney stones. He has not tried anything OTC for his symptoms.  He would also like a refill of Sildenafil today. He would like to know if he could get the 100 mg tabs and break them into quarters, for financial reasons.   Review of Systems      Past Medical History:  Diagnosis Date  . Chicken pox   . GERD (gastroesophageal reflux disease)     Current Outpatient Medications  Medication Sig Dispense Refill  . cetirizine (ZYRTEC) 10 MG tablet Take 1 tablet (10 mg total) by mouth daily. 30 tablet 11  . fluticasone (FLONASE) 50 MCG/ACT nasal spray Place 2 sprays into both nostrils daily. 16 g 6  . omeprazole (PRILOSEC) 20 MG capsule Take 1 capsule (20 mg total) by mouth daily. 90 capsule 3  . sildenafil (VIAGRA) 100 MG tablet Take 1 tablet (100 mg total) by mouth daily as needed for erectile dysfunction. 10 tablet 0   No current facility-administered medications for this visit.     No Known Allergies  Family History  Problem Relation Age of Onset  . Breast cancer Mother   . Hypertension Mother   . Breast cancer Maternal Grandmother   . Colon cancer Maternal Uncle   . Arthritis Paternal Grandmother   . Hypertension Paternal Grandmother   . Diabetes Neg Hx   . Stroke Neg Hx     Social History   Socioeconomic History    . Marital status: Married    Spouse name: Not on file  . Number of children: Not on file  . Years of education: Not on file  . Highest education level: Not on file  Occupational History  . Not on file  Social Needs  . Financial resource strain: Not on file  . Food insecurity:    Worry: Not on file    Inability: Not on file  . Transportation needs:    Medical: Not on file    Non-medical: Not on file  Tobacco Use  . Smoking status: Never Smoker  . Smokeless tobacco: Never Used  Substance and Sexual Activity  . Alcohol use: Yes    Alcohol/week: 0.0 oz    Comment: occasional-social  . Drug use: No  . Sexual activity: Yes  Lifestyle  . Physical activity:    Days per week: Not on file    Minutes per session: Not on file  . Stress: Not on file  Relationships  . Social connections:    Talks on phone: Not on file    Gets together: Not on file    Attends religious service: Not on file    Active member of club or organization: Not on file    Attends meetings of clubs or organizations: Not on file  Relationship status: Not on file  . Intimate partner violence:    Fear of current or ex partner: Not on file    Emotionally abused: Not on file    Physically abused: Not on file    Forced sexual activity: Not on file  Other Topics Concern  . Not on file  Social History Narrative  . Not on file     Constitutional: Denies fever, malaise, fatigue, headache or abrupt weight changes.  Gastrointestinal: Denies abdominal pain, bloating, constipation, diarrhea or blood in the stool.  GU: Pt reports dysuria, blood in urine. Denies urgency, frequency, pain with urination, burning sensation, odor or discharge. Skin: Denies redness, rashes, lesions or ulcercations.    No other specific complaints in a complete review of systems (except as listed in HPI above).  Objective:   Physical Exam   BP 124/80   Pulse 88   Temp 98.3 F (36.8 C) (Oral)   Wt 223 lb (101.2 kg)   SpO2 97%    BMI 34.93 kg/m  Wt Readings from Last 3 Encounters:  05/17/18 223 lb (101.2 kg)  01/12/18 225 lb (102.1 kg)  01/15/17 203 lb (92.1 kg)    General: Appears his stated age, obese in NAD. Abdomen: Soft and nontender. Normal bowel sounds. No distention or masses noted. No CVA tenderness.    BMET    Component Value Date/Time   NA 138 01/12/2018 1503   K 4.1 01/12/2018 1503   CL 102 01/12/2018 1503   CO2 29 01/12/2018 1503   GLUCOSE 91 01/12/2018 1503   BUN 13 01/12/2018 1503   CREATININE 1.30 01/12/2018 1503   CALCIUM 9.4 01/12/2018 1503    Lipid Panel     Component Value Date/Time   CHOL 183 01/12/2018 1503   TRIG 120.0 01/12/2018 1503   HDL 45.10 01/12/2018 1503   CHOLHDL 4 01/12/2018 1503   VLDL 24.0 01/12/2018 1503   LDLCALC 114 (H) 01/12/2018 1503    CBC    Component Value Date/Time   WBC 5.5 01/12/2018 1503   RBC 4.51 01/12/2018 1503   HGB 14.5 01/12/2018 1503   HCT 43.8 01/12/2018 1503   PLT 226.0 01/12/2018 1503   MCV 97.1 01/12/2018 1503   MCHC 33.2 01/12/2018 1503   RDW 12.2 01/12/2018 1503    Hgb A1C Lab Results  Component Value Date   HGBA1C 4.6 01/12/2018           Assessment & Plan:   Dysuria, Hematuria:  Urinalysis: 2+ blood Will send urine culture ? Stone, which he may have already passed Strainer provided Push fluids Will hold off on Flomax at this time  Erectile Dysfunction:  eRx for Sildenafil 100 mg as needed for sexual intercourse  Will follow up after urine culture, if dysuria/heamturia persists or worsens, RTC. Webb Silversmith, NP

## 2018-05-17 NOTE — Patient Instructions (Signed)

## 2018-05-18 LAB — URINE CULTURE
MICRO NUMBER:: 90699507
Result:: NO GROWTH
SPECIMEN QUALITY:: ADEQUATE

## 2018-05-23 ENCOUNTER — Encounter: Payer: Self-pay | Admitting: Internal Medicine

## 2018-08-17 ENCOUNTER — Ambulatory Visit (INDEPENDENT_AMBULATORY_CARE_PROVIDER_SITE_OTHER): Payer: BLUE CROSS/BLUE SHIELD | Admitting: Podiatry

## 2018-08-17 ENCOUNTER — Encounter: Payer: Self-pay | Admitting: Podiatry

## 2018-08-17 ENCOUNTER — Ambulatory Visit (INDEPENDENT_AMBULATORY_CARE_PROVIDER_SITE_OTHER): Payer: BLUE CROSS/BLUE SHIELD

## 2018-08-17 DIAGNOSIS — M722 Plantar fascial fibromatosis: Secondary | ICD-10-CM

## 2018-08-17 MED ORDER — METHYLPREDNISOLONE 4 MG PO TBPK: ORAL_TABLET | ORAL | 0 refills | Status: DC

## 2018-08-17 MED ORDER — MELOXICAM 15 MG PO TABS: 15.0000 mg | ORAL_TABLET | Freq: Every day | ORAL | 3 refills | Status: DC

## 2018-08-17 NOTE — Patient Instructions (Signed)
For instructions on how to put on your Plantar Fascial Brace, please visit [email or web address] For instructions on how to put on your Night Splint, please visit [email or web address]   Plantar Fasciitis (Heel Spur Syndrome) with Rehab The plantar fascia is a fibrous, ligament-like, soft-tissue structure that spans the bottom of the foot. Plantar fasciitis is a condition that causes pain in the foot due to inflammation of the tissue. SYMPTOMS   Pain and tenderness on the underneath side of the foot.  Pain that worsens with standing or walking. CAUSES  Plantar fasciitis is caused by irritation and injury to the plantar fascia on the underneath side of the foot. Common mechanisms of injury include:  Direct trauma to bottom of the foot.  Damage to a small nerve that runs under the foot where the main fascia attaches to the heel bone.  Stress placed on the plantar fascia due to bone spurs. RISK INCREASES WITH:   Activities that place stress on the plantar fascia (running, jumping, pivoting, or cutting).  Poor strength and flexibility.  Improperly fitted shoes.  Tight calf muscles.  Flat feet.  Failure to warm-up properly before activity.  Obesity. PREVENTION  Warm up and stretch properly before activity.  Allow for adequate recovery between workouts.  Maintain physical fitness:  Strength, flexibility, and endurance.  Cardiovascular fitness.  Maintain a health body weight.  Avoid stress on the plantar fascia.  Wear properly fitted shoes, including arch supports for individuals who have flat feet.  PROGNOSIS  If treated properly, then the symptoms of plantar fasciitis usually resolve without surgery. However, occasionally surgery is necessary.  RELATED COMPLICATIONS   Recurrent symptoms that may result in a chronic condition.  Problems of the lower back that are caused by compensating for the injury, such as limping.  Pain or weakness of the foot during  push-off following surgery.  Chronic inflammation, scarring, and partial or complete fascia tear, occurring more often from repeated injections.  TREATMENT  Treatment initially involves the use of ice and medication to help reduce pain and inflammation. The use of strengthening and stretching exercises may help reduce pain with activity, especially stretches of the Achilles tendon. These exercises may be performed at home or with a therapist. Your caregiver may recommend that you use heel cups of arch supports to help reduce stress on the plantar fascia. Occasionally, corticosteroid injections are given to reduce inflammation. If symptoms persist for greater than 6 months despite non-surgical (conservative), then surgery may be recommended.   MEDICATION   If pain medication is necessary, then nonsteroidal anti-inflammatory medications, such as aspirin and ibuprofen, or other minor pain relievers, such as acetaminophen, are often recommended.  Do not take pain medication within 7 days before surgery.  Prescription pain relievers may be given if deemed necessary by your caregiver. Use only as directed and only as much as you need.  Corticosteroid injections may be given by your caregiver. These injections should be reserved for the most serious cases, because they may only be given a certain number of times.  HEAT AND COLD  Cold treatment (icing) relieves pain and reduces inflammation. Cold treatment should be applied for 10 to 15 minutes every 2 to 3 hours for inflammation and pain and immediately after any activity that aggravates your symptoms. Use ice packs or massage the area with a piece of ice (ice massage).  Heat treatment may be used prior to performing the stretching and strengthening activities prescribed by your caregiver, physical therapist, or athletic trainer. Use a  heat pack or soak the injury in warm water.  SEEK IMMEDIATE MEDICAL CARE IF:  Treatment seems to offer no benefit,  or the condition worsens.  Any medications produce adverse side effects.  EXERCISES- RANGE OF MOTION (ROM) AND STRETCHING EXERCISES - Plantar Fasciitis (Heel Spur Syndrome) These exercises may help you when beginning to rehabilitate your injury. Your symptoms may resolve with or without further involvement from your physician, physical therapist or athletic trainer. While completing these exercises, remember:   Restoring tissue flexibility helps normal motion to return to the joints. This allows healthier, less painful movement and activity.  An effective stretch should be held for at least 30 seconds.  A stretch should never be painful. You should only feel a gentle lengthening or release in the stretched tissue.  RANGE OF MOTION - Toe Extension, Flexion  Sit with your right / left leg crossed over your opposite knee.  Grasp your toes and gently pull them back toward the top of your foot. You should feel a stretch on the bottom of your toes and/or foot.  Hold this stretch for 10 seconds.  Now, gently pull your toes toward the bottom of your foot. You should feel a stretch on the top of your toes and or foot.  Hold this stretch for 10 seconds. Repeat  times. Complete this stretch 3 times per day.   RANGE OF MOTION - Ankle Dorsiflexion, Active Assisted  Remove shoes and sit on a chair that is preferably not on a carpeted surface.  Place right / left foot under knee. Extend your opposite leg for support.  Keeping your heel down, slide your right / left foot back toward the chair until you feel a stretch at your ankle or calf. If you do not feel a stretch, slide your bottom forward to the edge of the chair, while still keeping your heel down.  Hold this stretch for 10 seconds. Repeat 3 times. Complete this stretch 2 times per day.   STRETCH  Gastroc, Standing  Place hands on wall.  Extend right / left leg, keeping the front knee somewhat bent.  Slightly point your toes inward  on your back foot.  Keeping your right / left heel on the floor and your knee straight, shift your weight toward the wall, not allowing your back to arch.  You should feel a gentle stretch in the right / left calf. Hold this position for 10 seconds. Repeat 3 times. Complete this stretch 2 times per day.  STRETCH  Soleus, Standing  Place hands on wall.  Extend right / left leg, keeping the other knee somewhat bent.  Slightly point your toes inward on your back foot.  Keep your right / left heel on the floor, bend your back knee, and slightly shift your weight over the back leg so that you feel a gentle stretch deep in your back calf.  Hold this position for 10 seconds. Repeat 3 times. Complete this stretch 2 times per day.  STRETCH  Gastrocsoleus, Standing  Note: This exercise can place a lot of stress on your foot and ankle. Please complete this exercise only if specifically instructed by your caregiver.   Place the ball of your right / left foot on a step, keeping your other foot firmly on the same step.  Hold on to the wall or a rail for balance.  Slowly lift your other foot, allowing your body weight to press your heel down over the edge of the step.  You  should feel a stretch in your right / left calf.  Hold this position for 10 seconds.  Repeat this exercise with a slight bend in your right / left knee. Repeat 3 times. Complete this stretch 2 times per day.   STRENGTHENING EXERCISES - Plantar Fasciitis (Heel Spur Syndrome)  These exercises may help you when beginning to rehabilitate your injury. They may resolve your symptoms with or without further involvement from your physician, physical therapist or athletic trainer. While completing these exercises, remember:   Muscles can gain both the endurance and the strength needed for everyday activities through controlled exercises.  Complete these exercises as instructed by your physician, physical therapist or athletic  trainer. Progress the resistance and repetitions only as guided.  STRENGTH - Towel Curls  Sit in a chair positioned on a non-carpeted surface.  Place your foot on a towel, keeping your heel on the floor.  Pull the towel toward your heel by only curling your toes. Keep your heel on the floor. Repeat 3 times. Complete this exercise 2 times per day.  STRENGTH - Ankle Inversion  Secure one end of a rubber exercise band/tubing to a fixed object (table, pole). Loop the other end around your foot just before your toes.  Place your fists between your knees. This will focus your strengthening at your ankle.  Slowly, pull your big toe up and in, making sure the band/tubing is positioned to resist the entire motion.  Hold this position for 10 seconds.  Have your muscles resist the band/tubing as it slowly pulls your foot back to the starting position. Repeat 3 times. Complete this exercises 2 times per day.  Document Released: 11/23/2005 Document Revised: 02/15/2012 Document Reviewed: 03/07/2009 [facility] Patient Information 2014 [place], [place].

## 2018-08-17 NOTE — Progress Notes (Signed)
Subjective:  Patient ID: Victor Carter, male    DOB: Aug 23, 1966,  MRN: [id number] HPI Chief Complaint  Patient presents with  . Foot Pain    Patient presents today for bilat heel pain x 1 year off and on.  He reports its a constant pain all day that is worse some days than others.  He reports his left foot is worse than the right.  He has used OTC insoles which have not helped    52 y.o. male presents with the above complaint.   ROS: Denies fever chills nausea vomiting muscle aches pains calf pain back pain chest pain shortness of breath.  Past Medical History:  Diagnosis Date  . Chicken pox   . GERD (gastroesophageal reflux disease)    Past Surgical History:  Procedure Laterality Date  . COLONOSCOPY WITH PROPOFOL N/A 01/15/2017   Procedure: COLONOSCOPY WITH PROPOFOL;  Surgeon: Jonathon Bellows, MD;  Location: [facility] ENDOSCOPY;  Service: Endoscopy;  Laterality: N/A;  . LEG TENDON SURGERY Left 1986  . WISDOM TOOTH EXTRACTION      Current Outpatient Medications:  .  albuterol (PROVENTIL HFA;VENTOLIN HFA) 108 (90 Base) MCG/ACT inhaler, 2 puffs q.i.d. p.r.n. short of breath, wheezing, or cough, Disp: , Rfl:  .  cetirizine (ZYRTEC) 10 MG tablet, Take 1 tablet (10 mg total) by mouth daily., Disp: 30 tablet, Rfl: 11 .  meloxicam (MOBIC) 15 MG tablet, Take 1 tablet (15 mg total) by mouth daily., Disp: 30 tablet, Rfl: 3 .  methylPREDNISolone (MEDROL DOSEPAK) 4 MG TBPK tablet, 6 day dose pack - take as directed, Disp: 21 tablet, Rfl: 0 .  omeprazole (PRILOSEC) 20 MG capsule, Take 1 capsule (20 mg total) by mouth daily., Disp: 90 capsule, Rfl: 3 .  sildenafil (VIAGRA) 100 MG tablet, Take 1 tablet (100 mg total) by mouth daily as needed for erectile dysfunction., Disp: 10 tablet, Rfl: 0  No Known Allergies Review of Systems Objective:  There were no vitals filed for this visit.  General: Well developed, nourished, in no acute distress, alert and oriented x3   Dermatological: Skin is warm, dry  and supple bilateral. Nails x 10 are well maintained; remaining integument appears unremarkable at this time. There are no open sores, no preulcerative lesions, no rash or signs of infection present.  Vascular: Dorsalis Pedis artery and Posterior Tibial artery pedal pulses are 2/4 bilateral with immedate capillary fill time. Pedal hair growth present. No varicosities and no lower extremity edema present bilateral.   Neruologic: Grossly intact via light touch bilateral. Vibratory intact via tuning fork bilateral. Protective threshold with Semmes Wienstein monofilament intact to all pedal sites bilateral. Patellar and Achilles deep tendon reflexes 2+ bilateral. No Babinski or clonus noted bilateral.   Musculoskeletal: No gross boney pedal deformities bilateral. No pain, crepitus, or limitation noted with foot and ankle range of motion bilateral. Muscular strength 5/5 in all groups tested bilateral.  Pain on palpation medial calcaneal tubercle of the left heel over the right.  No pain in medial and lateral compression of the calcaneus. Gait: Unassisted, Nonantalgic.    Radiographs:  Radiographs taken today demonstrate soft tissue increase in density of plantar fashion calcaneal insertion site with plantar distally oriented calcaneal heel spurs left greater than right.  No acute findings otherwise.  Assessment & Plan:   Assessment: Plantar fasciitis bilateral left greater than right.  Plan: Discussed etiology pathology conservative versus surgical therapies.  Discussed appropriate shoe gear stretching exercise ice therapy and shoe gear modifications.  Also injected the  left heel today with 20 mg of Kenalog 5 mg Marcaine left heel.  Tolerated procedure well.  I also started him on a Medrol Dosepak to be followed by meloxicam 7 plantar fascial brace bilaterally and a single night splint left.  We discussed the need for particular shoe gear and I will follow-up with him in 1 month and consider orthotics.   He works for [employer].     Victor Carter T. [place], [place]

## 2018-09-19 ENCOUNTER — Encounter: Payer: Self-pay | Admitting: Internal Medicine

## 2018-09-21 ENCOUNTER — Ambulatory Visit (INDEPENDENT_AMBULATORY_CARE_PROVIDER_SITE_OTHER): Payer: BLUE CROSS/BLUE SHIELD | Admitting: Podiatry

## 2018-09-21 ENCOUNTER — Encounter: Payer: Self-pay | Admitting: Podiatry

## 2018-09-21 DIAGNOSIS — M722 Plantar fascial fibromatosis: Secondary | ICD-10-CM | POA: Diagnosis not present

## 2018-09-21 MED ORDER — SILDENAFIL CITRATE 100 MG PO TABS: 100.0000 mg | ORAL_TABLET | Freq: Every day | ORAL | 2 refills | Status: DC | PRN

## 2018-09-21 MED ORDER — OMEPRAZOLE 40 MG PO CPDR: 40.0000 mg | DELAYED_RELEASE_CAPSULE | Freq: Every day | ORAL | 2 refills | Status: DC

## 2018-09-21 NOTE — Progress Notes (Signed)
He presents today for follow-up of his plantar fasciitis states that is doing a whole lot better approximately 90%.  Objective: Vital signs are stable he is alert oriented x3 continues to wear his plantar fascial brace at night splint and use his meloxicam..  He has no pain on palpation medial calcaneal tubercles bilateral.  Assessment: 90% resolved plantar fasciitis.  Plan: I feel is necessary for him to get a pair of orthotics built to help reduce the likelihood of recurrence of his severe plantar fasciitis.

## 2018-10-19 ENCOUNTER — Other Ambulatory Visit: Payer: BLUE CROSS/BLUE SHIELD | Admitting: Orthotics

## 2018-11-20 DIAGNOSIS — J101 Influenza due to other identified influenza virus with other respiratory manifestations: Secondary | ICD-10-CM | POA: Diagnosis not present

## 2018-11-20 DIAGNOSIS — R509 Fever, unspecified: Secondary | ICD-10-CM | POA: Diagnosis not present

## 2019-03-08 ENCOUNTER — Emergency Department
Admission: EM | Admit: 2019-03-08 | Discharge: 2019-03-08 | Disposition: A | Discharge status: Home / Self Care | Payer: BLUE CROSS/BLUE SHIELD | Attending: Emergency Medicine | Admitting: Emergency Medicine

## 2019-03-08 ENCOUNTER — Encounter: Payer: Self-pay | Admitting: Emergency Medicine

## 2019-03-08 ENCOUNTER — Emergency Department: Payer: BLUE CROSS/BLUE SHIELD

## 2019-03-08 ENCOUNTER — Encounter: Payer: Self-pay | Admitting: Internal Medicine

## 2019-03-08 ENCOUNTER — Other Ambulatory Visit: Payer: Self-pay

## 2019-03-08 DIAGNOSIS — R109 Unspecified abdominal pain: Secondary | ICD-10-CM | POA: Diagnosis not present

## 2019-03-08 DIAGNOSIS — Z79899 Other long term (current) drug therapy: Secondary | ICD-10-CM | POA: Insufficient documentation

## 2019-03-08 DIAGNOSIS — R0789 Other chest pain: Secondary | ICD-10-CM | POA: Diagnosis not present

## 2019-03-08 DIAGNOSIS — R9431 Abnormal electrocardiogram [ECG] [EKG]: Secondary | ICD-10-CM | POA: Diagnosis not present

## 2019-03-08 DIAGNOSIS — R1013 Epigastric pain: Secondary | ICD-10-CM | POA: Diagnosis not present

## 2019-03-08 DIAGNOSIS — R079 Chest pain, unspecified: Secondary | ICD-10-CM | POA: Diagnosis not present

## 2019-03-08 DIAGNOSIS — R350 Frequency of micturition: Secondary | ICD-10-CM | POA: Diagnosis not present

## 2019-03-08 DIAGNOSIS — R140 Abdominal distension (gaseous): Secondary | ICD-10-CM | POA: Insufficient documentation

## 2019-03-08 HISTORY — DX: Sickle-cell disease without crisis: D57.1

## 2019-03-08 LAB — COMPREHENSIVE METABOLIC PANEL
ALT: 41 U/L (ref 0–44)
AST: 35 U/L (ref 15–41)
Albumin: 4.6 g/dL (ref 3.5–5.0)
Alkaline Phosphatase: 90 U/L (ref 38–126)
Anion gap: 10 (ref 5–15)
BUN: 15 mg/dL (ref 6–20)
CO2: 26 mmol/L (ref 22–32)
Calcium: 9.3 mg/dL (ref 8.9–10.3)
Chloride: 103 mmol/L (ref 98–111)
Creatinine, Ser: 1.42 mg/dL — ABNORMAL HIGH (ref 0.61–1.24)
GFR calc Af Amer: >60 mL/min (ref >60)
GFR calc non Af Amer: 57 mL/min — ABNORMAL LOW (ref >60)
Glucose, Bld: 114 mg/dL — ABNORMAL HIGH (ref 70–99)
Potassium: 4.1 mmol/L (ref 3.5–5.1)
Sodium: 139 mmol/L (ref 135–145)
Total Bilirubin: 1 mg/dL (ref 0.3–1.2)
Total Protein: 8.2 g/dL — ABNORMAL HIGH (ref 6.5–8.1)

## 2019-03-08 LAB — URINALYSIS, COMPLETE (UACMP) WITH MICROSCOPIC
Bacteria, UA: NONE SEEN
Bilirubin Urine: NEGATIVE
Glucose, UA: NEGATIVE mg/dL
Ketones, ur: NEGATIVE mg/dL
Leukocytes,Ua: NEGATIVE
Nitrite: NEGATIVE
Protein, ur: 100 mg/dL — AB
Specific Gravity, Urine: 1.014 (ref 1.005–1.030)
pH: 5 (ref 5.0–8.0)

## 2019-03-08 LAB — CBC WITH DIFFERENTIAL/PLATELET
Abs Immature Granulocytes: 0.03 10*3/uL (ref 0.00–0.07)
Basophils Absolute: 0 10*3/uL (ref 0.0–0.1)
Basophils Relative: 0 %
Eosinophils Absolute: 0.1 10*3/uL (ref 0.0–0.5)
Eosinophils Relative: 2 %
HCT: 44.3 % (ref 39.0–52.0)
Hemoglobin: 15.1 g/dL (ref 13.0–17.0)
Immature Granulocytes: 0 %
Lymphocytes Relative: 18 %
Lymphs Abs: 1.5 10*3/uL (ref 0.7–4.0)
MCH: 31.9 pg (ref 26.0–34.0)
MCHC: 34.1 g/dL (ref 30.0–36.0)
MCV: 93.7 fL (ref 80.0–100.0)
Monocytes Absolute: 0.8 10*3/uL (ref 0.1–1.0)
Monocytes Relative: 9 %
Neutro Abs: 5.8 10*3/uL (ref 1.7–7.7)
Neutrophils Relative %: 71 %
Platelets: 243 10*3/uL (ref 150–400)
RBC: 4.73 MIL/uL (ref 4.22–5.81)
RDW: 11.6 % (ref 11.5–15.5)
WBC: 8.2 10*3/uL (ref 4.0–10.5)
nRBC: 0 % (ref 0.0–0.2)

## 2019-03-08 LAB — TROPONIN I
Troponin I: <0.03 ng/mL (ref <0.03)
Troponin I: <0.03 ng/mL (ref <0.03)

## 2019-03-08 LAB — LIPASE, BLOOD: Lipase: 32 U/L (ref 11–51)

## 2019-03-08 MED ORDER — NITROGLYCERIN 0.4 MG SL SUBL
0.4000 mg | SUBLINGUAL_TABLET | SUBLINGUAL | Status: DC | PRN
  Administered 2019-03-08 (×3): 0.4 mg via SUBLINGUAL
  Filled 2019-03-08: qty 1

## 2019-03-08 MED ORDER — IOPAMIDOL (ISOVUE-370) INJECTION 76%
100.0000 mL | Freq: Once | INTRAVENOUS | Status: AC | PRN
  Administered 2019-03-08: 100 mL via INTRAVENOUS

## 2019-03-08 MED ORDER — IOHEXOL 350 MG/ML SOLN: 100.0000 mL | Freq: Once | INTRAVENOUS | Status: DC | PRN

## 2019-03-08 MED ORDER — SODIUM CHLORIDE 0.9 % IV BOLUS
500.0000 mL | Freq: Once | INTRAVENOUS | Status: AC
  Administered 2019-03-08: 500 mL via INTRAVENOUS

## 2019-03-08 NOTE — ED Triage Notes (Signed)
First RN Note: Pt presents to ED via POV with c/o being unable to sleep due being "gassy". Pt states when he lays down he feels gassy, points to his chestn and motions upwards, pt also c/o multiple times of getting up and urinating during the night. Pt with hx of GERD, states has been taking his medications. Pt also c/o not being able to have a BM. States was referred to ER from tele-doc.

## 2019-03-08 NOTE — ED Triage Notes (Addendum)
Pt in via POV, reports generalized abdominal pain and urinary frequency x 3 days.  Denies N/VD.  Ambulatory to triage.  NAD noted at this time.

## 2019-03-08 NOTE — ED Notes (Signed)
Pt c/o "acid reflux" for the past 2 weeks and abd pain with bloating for the past 2 days - reports he is unable to burp or pass gas - he reports last BM 2 days ago - denies chest pain, SHOB, N/V - reports frequent urination during the night that normalizes during the day - reports he has had taken anything for the pain but took "gasX and reflux medication" with no relief of reflux or gas symptoms

## 2019-03-08 NOTE — Discharge Instructions (Addendum)

## 2019-03-08 NOTE — ED Notes (Signed)
Patient transported to X-ray 

## 2019-03-08 NOTE — ED Notes (Signed)
Last BM about 2 days ago- denies shortness of breath and chest pain

## 2019-03-08 NOTE — ED Provider Notes (Signed)
[facility] Emergency Department Provider Note  ____________________________________________  Time seen: Approximately 10:27 AM  I have reviewed the triage vital signs and the nursing notes.   HISTORY  Chief Complaint Abdominal Pain   HPI Victor Carter is a 53 y.o. male with a history of GERD who presents for evaluation of abdominal discomfort.  Patient reports 2 days of feeling bloated, not being able to pass gas, feeling generalized pressure in his abdomen.  The symptoms are worse when he lays flat. At night, patient reports that pressure moves up into his chest as well and he has difficulty breathing. Has been sleeping in a recliner.  He denies chest pain or shortness of breath during the day or while standing up.  No prior abdominal surgeries.  Had a colonoscopy last year showing polyps only.  Patient reports that he usually is very regular with his bowel movements however over the last week he has not been.  Last bowel movement 2 days ago.  He is also complaining of nocturia but during the day has no dysuria or frequency, no flank pain.  No nausea, vomiting. patient has been taking Tums with no significant relief.  Past Medical History:  Diagnosis Date  . Chicken pox   . GERD (gastroesophageal reflux disease)   . Sickle cell anemia (HCC)    trait    Patient Active Problem List   Diagnosis Date Noted  . Erectile dysfunction 01/12/2018  . Gastroesophageal reflux disease 11/26/2016    Past Surgical History:  Procedure Laterality Date  . COLONOSCOPY WITH PROPOFOL N/A 01/15/2017   Procedure: COLONOSCOPY WITH PROPOFOL;  Surgeon: Jonathon Bellows, MD;  Location: [facility] ENDOSCOPY;  Service: Endoscopy;  Laterality: N/A;  . LEG TENDON SURGERY Left 1986  . WISDOM TOOTH EXTRACTION      Prior to Admission medications   Medication Sig Start Date End Date Taking? Authorizing Provider  albuterol (PROVENTIL HFA;VENTOLIN HFA) 108 (90 Base) MCG/ACT inhaler 2 puffs q.i.d.  p.r.n. short of breath, wheezing, or cough 08/04/17  Yes [provider]  cetirizine (ZYRTEC) 10 MG tablet Take 1 tablet (10 mg total) by mouth daily. 01/12/18  Yes Jearld Fenton, NP  omeprazole (PRILOSEC) 40 MG capsule Take 1 capsule (40 mg total) by mouth daily. 09/21/18  Yes Jearld Fenton, NP    Allergies Patient has no known allergies.  Family History  Problem Relation Age of Onset  . Breast cancer Mother   . Hypertension Mother   . Breast cancer Maternal Grandmother   . Colon cancer Maternal Uncle   . Arthritis Paternal Grandmother   . Hypertension Paternal Grandmother   . Diabetes Neg Hx   . Stroke Neg Hx     Social History Social History   Tobacco Use  . Smoking status: Never Smoker  . Smokeless tobacco: Never Used  Substance Use Topics  . Alcohol use: Yes    Alcohol/week: 0.0 standard drinks    Comment: occasional-social  . Drug use: No    Review of Systems  Constitutional: Negative for fever. Eyes: Negative for visual changes. ENT: Negative for sore throat. Neck: No neck pain  Cardiovascular: Negative for chest pain. Respiratory: Negative for shortness of breath. Gastrointestinal: + abdominal distention, constipation. Genitourinary: Negative for dysuria. Musculoskeletal: Negative for back pain. Skin: Negative for rash. Neurological: Negative for headaches, weakness or numbness. Psych: No SI or HI  ____________________________________________   PHYSICAL EXAM:  VITAL SIGNS: ED Triage Vitals  Enc Vitals Group     BP  03/08/19 1013 (!) 161/95     Pulse Rate 03/08/19 1013 100     Resp 03/08/19 1013 16     Temp 03/08/19 1013 98.3 F (36.8 C)     Temp Source 03/08/19 1013 Oral     SpO2 03/08/19 1013 98 %     Weight --      Height --      Head Circumference --      Peak Flow --      Pain Score 03/08/19 1014 6     Pain Loc --      Pain Edu? --      Excl. in [place]? --     Constitutional: Alert and oriented. Well appearing and in no  apparent distress. HEENT:      Head: Normocephalic and atraumatic.         Eyes: Conjunctivae are normal. Sclera is non-icteric.       Mouth/Throat: Mucous membranes are moist.       Neck: Supple with no signs of meningismus. Cardiovascular: Regular rate and rhythm. No murmurs, gallops, or rubs. 2+ symmetrical distal pulses are present in all extremities. No JVD. Respiratory: Normal respiratory effort. Lungs are clear to auscultation bilaterally. No wheezes, crackles, or rhonchi.  Gastrointestinal: Obese, reducible umbilical hernia, positive bowel sounds, non tender. No rebound or guarding. Genitourinary: No CVA tenderness. Musculoskeletal: Nontender with normal range of motion in all extremities. No edema, cyanosis, or erythema of extremities. Neurologic: Normal speech and language. Face is symmetric. Moving all extremities. No gross focal neurologic deficits are appreciated. Skin: Skin is warm, dry and intact. No rash noted. Psychiatric: Mood and affect are normal. Speech and behavior are normal.  ____________________________________________   LABS (all labs ordered are listed, but only abnormal results are displayed)  Labs Reviewed  COMPREHENSIVE METABOLIC PANEL - Abnormal; Notable for the following components:      Result Value   Glucose, Bld 114 (*)    Creatinine, Ser 1.42 (*)    Total Protein 8.2 (*)    GFR calc non Af Amer 57 (*)    All other components within normal limits  URINALYSIS, COMPLETE (UACMP) WITH MICROSCOPIC - Abnormal; Notable for the following components:   Color, Urine YELLOW (*)    APPearance CLEAR (*)    Hgb urine dipstick SMALL (*)    Protein, ur 100 (*)    All other components within normal limits  LIPASE, BLOOD  CBC WITH DIFFERENTIAL/PLATELET  TROPONIN I  TROPONIN I   ____________________________________________  EKG  ED ECG REPORT I, Rudene Re, the attending physician, personally viewed and interpreted this ECG.  Normal sinus rhythm,  rate of 82, normal intervals, normal axis, diffuse T wave inversions in inferior and lateral leads with no ST elevation.  No prior for comparison  14:16 - NSR, rate 86, normal intervals, normal axis, persistent diffuse T wave inversions in inferior lateral leads with no ST elevation.  Unchanged from initial. ____________________________________________  RADIOLOGY  I have personally reviewed the images performed during this visit and I agree with the Radiologist's read.   Interpretation by Radiologist:  Dg Chest 2 View  Result Date: 03/08/2019 CLINICAL DATA:  53 year old male with epigastric pain for 3 days EXAM: CHEST - 2 VIEW COMPARISON:  03/08/2019 FINDINGS: Cardiomediastinal silhouette within normal limits. No evidence of central vascular congestion or interlobular septal thickening. Low lung volumes. No confluent airspace disease. No pneumothorax. No pleural effusion. No displaced fracture. IMPRESSION: Negative for acute cardiopulmonary disease Electronically Signed  By: Corrie Mckusick D.O.   On: 03/08/2019 12:02   Dg Abd 2 Views  Result Date: 03/08/2019 CLINICAL DATA:  Abdominal pressure with increased urination for 1 week. Bloating. EXAM: ABDOMEN - 2 VIEW COMPARISON:  None. FINDINGS: Scattered gas and stool are present in nondilated colon. There is also scattered small bowel gas. No dilated loops of bowel are seen to suggest obstruction. No abnormal soft tissue calcification or acute osseous abnormality is seen. The visualized lung bases are grossly clear. IMPRESSION: Negative. Electronically Signed   By: Logan Bores M.D.   On: 03/08/2019 10:50   Ct Angio Chest/abd/pel For Dissection W And/or Wo Contrast  Result Date: 03/08/2019 CLINICAL DATA:  53 year old male with gassiness, constipation and chest pain EXAM: CT ANGIOGRAPHY CHEST, ABDOMEN AND PELVIS TECHNIQUE: Multidetector CT imaging through the chest, abdomen and pelvis was performed using the standard protocol during bolus administration  of intravenous contrast. Multiplanar reconstructed images and MIPs were obtained and reviewed to evaluate the vascular anatomy. CONTRAST:  138mL ISOVUE-370 IOPAMIDOL (ISOVUE-370) INJECTION 76% COMPARISON:  Chest x-ray obtained earlier today FINDINGS: CTA CHEST FINDINGS Cardiovascular: Initial unenhanced CT imaging of the chest demonstrates no evidence of acute intramural hematoma. Following administration of intravenous contrast there is excellent opacification of the thoracic aorta which is normal in caliber and without evidence of dissection. Two vessel aortic arch. The right brachiocephalic and left common carotid artery share a common origin. Pulmonary arteries are also relatively well opacified. No evidence of central or lobar pulmonary embolus. The heart is normal in size. No pericardial effusion. Mediastinum/Nodes: Unremarkable CT appearance of the thyroid gland. No suspicious mediastinal or hilar adenopathy. No soft tissue mediastinal mass. The thoracic esophagus is unremarkable. Lungs/Pleura: Lungs are clear. No pleural effusion or pneumothorax. Musculoskeletal: No acute fracture or aggressive appearing lytic or blastic osseous lesion. Review of the MIP images confirms the above findings. CTA ABDOMEN AND PELVIS FINDINGS VASCULAR Aorta: Normal caliber aorta without aneurysm, dissection, vasculitis or significant stenosis. Celiac: Patent without evidence of aneurysm, dissection, vasculitis or significant stenosis. The lateral segmental branch of the left hepatic artery is replaced to the left gastric artery. SMA: Patent without evidence of aneurysm, dissection, vasculitis or significant stenosis. Renals: Both renal arteries are patent without evidence of aneurysm, dissection, vasculitis, fibromuscular dysplasia or significant stenosis. IMA: Patent without evidence of aneurysm, dissection, vasculitis or significant stenosis. Inflow: Patent without evidence of aneurysm, dissection, vasculitis or significant  stenosis. Veins: No obvious venous abnormality within the limitations of this arterial phase study. Review of the MIP images confirms the above findings. NON-VASCULAR Hepatobiliary: No focal liver abnormality is seen. No gallstones, gallbladder wall thickening, or biliary dilatation. Pancreas: Unremarkable. No pancreatic ductal dilatation or surrounding inflammatory changes. Spleen: Normal in size without focal abnormality. Adrenals/Urinary Tract: Adrenal glands are unremarkable. Kidneys are normal, without renal calculi, focal lesion, or hydronephrosis. Bladder is unremarkable. Stomach/Bowel: Stomach is within normal limits. Appendix appears normal. No evidence of bowel wall thickening, distention, or inflammatory changes. Lymphatic: No significant vascular findings are present. No enlarged abdominal or pelvic lymph nodes. Reproductive: Prostate is unremarkable. Other: No ascites.  Fat containing umbilical hernia. Musculoskeletal: No acute fracture or aggressive appearing lytic or blastic osseous lesion. Review of the MIP images confirms the above findings. IMPRESSION: 1. No evidence of acute thoracic or abdominal aortic dissection or other acute vascular abnormality. 2. No evidence of acute cardiopulmonary process. 3. No evidence of acute abnormality within the abdomen or pelvis. 4. Small fat containing umbilical hernia. 5. Incidental note  is made of a replaced left hepatic artery. Electronically Signed   By: Jacqulynn Cadet M.D.   On: 03/08/2019 13:18      ____________________________________________   PROCEDURES  Procedure(s) performed: None Procedures Critical Care performed:  None ____________________________________________   INITIAL IMPRESSION / ASSESSMENT AND PLAN / ED COURSE   53 y.o. male with a history of GERD who presents for evaluation of abdominal distention, bloating, constipation.  Patient is obese with no tenderness throughout, positive bowel sounds.  Vitals are within normal  limits. Patient with no prior abdominal surgeries and up-to-date colonoscopy done 1 year ago showing a few polyps but no evidence of mass, therefore low suspicion for obstruction however it is possible. Ddx constipation, UTI, SBO, ACS. KUB with no air fluid levels. Labs WNL with no leukocytosis, normal electrolytes, normal liver panel. Will check UA to eval for UTI. EKG and troponin.     _________________________ 2:30 PM on 03/08/2019 -----------------------------------------  Patient has an EKG showing T wave inversions in inferior and lateral leads with no prior for comparison.  Therefore he was sent for CT angio of the chest and abdomen since patient was complaining of chest pain and abdominal pain.  CT is essentially unremarkable with no evidence of a PE, dissection, pericardial effusion, pneumonia, small bowel obstruction, constipation.  Patient had 2 cardiac enzymes every 3 hours apart which were both negative.  Repeat EKG is unchanged.  Patient's bloating sensation is unchanged after sublingual nitro.  At this time, with normal labs, normal vitals, normal CT c/a/p, I believe patient is stable for discharge but I will refer him to cardiology due to his abnormal EKG.  I discussed my standard return precautions with patient.   As part of my medical decision making, I reviewed the following data within the [facility] notes reviewed and incorporated, Labs reviewed , EKG interpreted , Old chart reviewed, Radiograph reviewed , Notes from prior ED visits and [place] Controlled Substance Database    Pertinent labs & imaging results that were available during my care of the patient were reviewed by me and considered in my medical decision making (see chart for details).    ____________________________________________   FINAL CLINICAL IMPRESSION(S) / ED DIAGNOSES  Final diagnoses:  Bloating  Chest pressure  Abnormal EKG      NEW MEDICATIONS STARTED DURING THIS VISIT:   ED Discharge Orders    None       Note:  This document was prepared using Dragon voice recognition software and may include unintentional dictation errors.    Alfred Levins, [place], MD 03/08/19 1434

## 2019-03-09 ENCOUNTER — Encounter: Payer: Self-pay | Admitting: Internal Medicine

## 2019-03-09 ENCOUNTER — Ambulatory Visit (INDEPENDENT_AMBULATORY_CARE_PROVIDER_SITE_OTHER): Payer: BLUE CROSS/BLUE SHIELD | Admitting: Internal Medicine

## 2019-03-09 DIAGNOSIS — R140 Abdominal distension (gaseous): Secondary | ICD-10-CM | POA: Diagnosis not present

## 2019-03-09 DIAGNOSIS — K5900 Constipation, unspecified: Secondary | ICD-10-CM

## 2019-03-09 DIAGNOSIS — F411 Generalized anxiety disorder: Secondary | ICD-10-CM

## 2019-03-09 DIAGNOSIS — K219 Gastro-esophageal reflux disease without esophagitis: Secondary | ICD-10-CM | POA: Diagnosis not present

## 2019-03-09 DIAGNOSIS — R0789 Other chest pain: Secondary | ICD-10-CM

## 2019-03-09 MED ORDER — OMEPRAZOLE 40 MG PO CPDR: 40.0000 mg | DELAYED_RELEASE_CAPSULE | Freq: Every day | ORAL | 1 refills | Status: DC

## 2019-03-09 MED ORDER — HYDROXYZINE HCL 10 MG PO TABS: 10.0000 mg | ORAL_TABLET | Freq: Three times a day (TID) | ORAL | 0 refills | Status: DC | PRN

## 2019-03-09 NOTE — Progress Notes (Signed)
Virtual Visit via Video Note  I connected with Nolene Bernheim on 03/09/19 at 10:45 AM EDT by a video enabled telemedicine application and verified that I am speaking with the correct person using two identifiers.   I discussed the limitations of evaluation and management by telemedicine and the availability of in person appointments. The patient expressed understanding and agreed to proceed.  History of Present Illness:  Pt due for ER follow up. He went to the ER 4/1 with c/o abdominal bloating, chest pressure and shortness of breath. Labs showed and elevated creatinine but otherwise unremarkable. ECG showed some inverted T waves but no ST elevation. Chest xray was negative. KUB was negative. CTA of the chest was negative for PE. His chest pressure did not respond to multiple doses of Nitro. Because of the negative findings, he was discharged and advised to follow up with PCP. He was referred to cardiology as an outpatient to follow up abnormal ECG. Since discharge, he reports persistent reflux, abdominal bloating and trouble with his bowels. He has taken his Omeprazole daily for the last 3 days. He has been taking laxatives and now has nothing but loose, watery stools. He has not noticed any blood in his stool. He does reports feeling anxious, stressed. He is currently not working, concerned about his finances and concerned with everything surrounding this pandemic. He is having trouble sleeping at night. He has never been treated for anxiety in the past. He denies depression, SI/HI.      Observations/Objective:  Alert and oriented x 3 NAD There is obvious concern/worry noted on his face Thought content, behavior and judgement appear normal  Assessment and Plan:  ER Follow Up for Chest Pressure, GERD, Abdominal Bloating and Constipation:  ER notes, labs and imaging reviewed This does not appear cardiac related Will refill Omeprazole, advised him to take daily x 2 weeks  Will obtain H  Pylori stool specimen Encouraged clear liquid/bland diet, advance as tolerated  GAD:  Support offered today Will trial Hydroxyzine 10 mg TID prn  Advised him to update me in 2 weeks via mychart/Webex  Follow Up Instructions:    I discussed the assessment and treatment plan with the patient. The patient was provided an opportunity to ask questions and all were answered. The patient agreed with the plan and demonstrated an understanding of the instructions.   The patient was advised to call back or seek an in-person evaluation if the symptoms worsen or if the condition fails to improve as anticipated.    Webb Silversmith, NP

## 2019-03-09 NOTE — Patient Instructions (Signed)

## 2019-03-10 DIAGNOSIS — R0789 Other chest pain: Secondary | ICD-10-CM | POA: Diagnosis not present

## 2019-03-10 DIAGNOSIS — R140 Abdominal distension (gaseous): Secondary | ICD-10-CM | POA: Diagnosis not present

## 2019-03-10 DIAGNOSIS — R0602 Shortness of breath: Secondary | ICD-10-CM | POA: Insufficient documentation

## 2019-03-10 DIAGNOSIS — K5900 Constipation, unspecified: Secondary | ICD-10-CM | POA: Diagnosis not present

## 2019-03-10 DIAGNOSIS — J209 Acute bronchitis, unspecified: Secondary | ICD-10-CM | POA: Insufficient documentation

## 2019-03-10 DIAGNOSIS — K219 Gastro-esophageal reflux disease without esophagitis: Secondary | ICD-10-CM | POA: Diagnosis not present

## 2019-03-10 NOTE — Addendum Note (Signed)
Addended by: Ellamae Sia on: 03/10/2019 12:37 PM   Modules accepted: Orders

## 2019-03-13 LAB — HELICOBACTER PYLORI  SPECIAL ANTIGEN
MICRO NUMBER:: 374144
SPECIMEN QUALITY: ADEQUATE

## 2019-03-14 ENCOUNTER — Encounter: Payer: Self-pay | Admitting: Internal Medicine

## 2019-03-14 MED ORDER — TRAZODONE HCL 50 MG PO TABS: 25.0000 mg | ORAL_TABLET | Freq: Every evening | ORAL | 0 refills | Status: DC | PRN

## 2019-03-14 MED ORDER — CETIRIZINE HCL 10 MG PO TABS: 10.0000 mg | ORAL_TABLET | Freq: Every day | ORAL | 3 refills | Status: DC

## 2019-03-14 NOTE — Addendum Note (Signed)
Addended by: Jearld Fenton on: 03/14/2019 10:11 AM   Modules accepted: Orders

## 2019-03-22 DIAGNOSIS — R0789 Other chest pain: Secondary | ICD-10-CM | POA: Diagnosis not present

## 2019-03-22 DIAGNOSIS — R0602 Shortness of breath: Secondary | ICD-10-CM | POA: Diagnosis not present

## 2019-03-28 DIAGNOSIS — R0789 Other chest pain: Secondary | ICD-10-CM | POA: Diagnosis not present

## 2019-03-28 DIAGNOSIS — K219 Gastro-esophageal reflux disease without esophagitis: Secondary | ICD-10-CM | POA: Diagnosis not present

## 2019-03-28 DIAGNOSIS — R0602 Shortness of breath: Secondary | ICD-10-CM | POA: Diagnosis not present

## 2019-05-17 ENCOUNTER — Encounter: Payer: Self-pay | Admitting: Internal Medicine

## 2019-05-17 MED ORDER — TRAZODONE HCL 50 MG PO TABS: 25.0000 mg | ORAL_TABLET | Freq: Every evening | ORAL | 1 refills | Status: DC | PRN

## 2019-05-17 MED ORDER — SILDENAFIL CITRATE 100 MG PO TABS: 100.0000 mg | ORAL_TABLET | Freq: Every day | ORAL | 0 refills | Status: DC | PRN

## 2019-09-29 ENCOUNTER — Encounter: Payer: Self-pay | Admitting: Internal Medicine

## 2019-09-29 MED ORDER — OMEPRAZOLE 40 MG PO CPDR: 40.0000 mg | DELAYED_RELEASE_CAPSULE | Freq: Every day | ORAL | 0 refills | Status: DC

## 2019-09-29 NOTE — Telephone Encounter (Signed)
Rx sent. Message to pt to call office to schedule appt.

## 2019-11-14 ENCOUNTER — Encounter: Payer: Self-pay | Admitting: Internal Medicine

## 2019-11-20 MED ORDER — CETIRIZINE HCL 10 MG PO TABS: 10.0000 mg | ORAL_TABLET | Freq: Every day | ORAL | 0 refills | Status: DC

## 2019-11-20 NOTE — Addendum Note (Signed)
Addended by: Lurlean Nanny on: 11/20/2019 04:52 PM   Modules accepted: Orders

## 2019-11-23 ENCOUNTER — Other Ambulatory Visit: Payer: Self-pay

## 2019-11-23 MED ORDER — OMEPRAZOLE 40 MG PO CPDR: 40.0000 mg | DELAYED_RELEASE_CAPSULE | Freq: Every day | ORAL | 0 refills | Status: DC

## 2019-12-21 ENCOUNTER — Encounter: Payer: Self-pay | Admitting: Internal Medicine

## 2019-12-21 ENCOUNTER — Ambulatory Visit: Payer: BC Managed Care – PPO | Admitting: Internal Medicine

## 2019-12-21 ENCOUNTER — Other Ambulatory Visit: Payer: Self-pay

## 2019-12-21 VITALS — BP 136/84 | HR 84 | Temp 97.8°F | Ht 67.0 in | Wt 236.0 lb

## 2019-12-21 DIAGNOSIS — K219 Gastro-esophageal reflux disease without esophagitis: Secondary | ICD-10-CM

## 2019-12-21 DIAGNOSIS — R0982 Postnasal drip: Secondary | ICD-10-CM | POA: Diagnosis not present

## 2019-12-21 DIAGNOSIS — N529 Male erectile dysfunction, unspecified: Secondary | ICD-10-CM

## 2019-12-21 DIAGNOSIS — Z125 Encounter for screening for malignant neoplasm of prostate: Secondary | ICD-10-CM

## 2019-12-21 DIAGNOSIS — Z0000 Encounter for general adult medical examination without abnormal findings: Secondary | ICD-10-CM | POA: Diagnosis not present

## 2019-12-21 DIAGNOSIS — F5104 Psychophysiologic insomnia: Secondary | ICD-10-CM

## 2019-12-21 DIAGNOSIS — G4700 Insomnia, unspecified: Secondary | ICD-10-CM | POA: Insufficient documentation

## 2019-12-21 MED ORDER — SILDENAFIL CITRATE 100 MG PO TABS: 100.0000 mg | ORAL_TABLET | Freq: Every day | ORAL | 5 refills | Status: DC | PRN

## 2019-12-21 MED ORDER — HYDROXYZINE HCL 25 MG PO TABS: 25.0000 mg | ORAL_TABLET | Freq: Three times a day (TID) | ORAL | 0 refills | Status: DC | PRN

## 2019-12-21 NOTE — Assessment & Plan Note (Signed)
Will increase Hydroxyzine to 25 mg PO QHS

## 2019-12-21 NOTE — Assessment & Plan Note (Signed)
Continue Omeprazole Avoid foods that trigger your reflux CBC and CMET today

## 2019-12-21 NOTE — Patient Instructions (Signed)

## 2019-12-21 NOTE — Addendum Note (Signed)
Addended by: Ellamae Sia on: 12/21/2019 04:22 PM   Modules accepted: Orders

## 2019-12-21 NOTE — Progress Notes (Signed)
Subjective:    Patient ID: Victor Carter, male    DOB: 01-Apr-1966, 54 y.o.   MRN: [id number]  HPI  Pt presents to the clinic today for his annual exam. He is also due to follow up chronic conditions.   GERD: Triggered by stress. He denies breakthrough on Omeprazole. There is no upper GI on file.  Insomnia: He has trouble staying asleep. He does not take Trazadone because he does not like the way it makes him feel. There is no sleep study on file. .  ED: He has difficulty maintaining an erection. He takes Sildenafil as needed with good results.  Flu: 09/2018 Tetanus: 11/2015  PSA Screening: 01/2018 Colon Screening: 01/2017  Vision Screening: annually Dentist: biannually  Diet: He does eat meat. He consumes fruits and veggies daily. He does eat some fried foods. He drinks water, soda. Exercise: None  Review of Systems      Past Medical History:  Diagnosis Date  . Chicken pox   . GERD (gastroesophageal reflux disease)   . Sickle cell anemia (HCC)    trait    Current Outpatient Medications  Medication Sig Dispense Refill  . cetirizine (ZYRTEC) 10 MG tablet Take 1 tablet (10 mg total) by mouth daily. 90 tablet 0  . hydrOXYzine (ATARAX/VISTARIL) 10 MG tablet Take 1 tablet (10 mg total) by mouth 3 (three) times daily as needed. 30 tablet 0  . omeprazole (PRILOSEC) 40 MG capsule Take 1 capsule (40 mg total) by mouth daily. 90 capsule 0  . sildenafil (VIAGRA) 100 MG tablet Take 1 tablet (100 mg total) by mouth daily as needed for erectile dysfunction. 10 tablet 0  . traZODone (DESYREL) 50 MG tablet Take 0.5-1 tablets (25-50 mg total) by mouth at bedtime as needed for sleep. 30 tablet 1   No current facility-administered medications for this visit.    No Known Allergies  Family History  Problem Relation Age of Onset  . Breast cancer Mother   . Hypertension Mother   . Breast cancer Maternal Grandmother   . Colon cancer Maternal Uncle   . Arthritis Paternal Grandmother     . Hypertension Paternal Grandmother   . Diabetes Neg Hx   . Stroke Neg Hx     Social History   Socioeconomic History  . Marital status: Married    Spouse name: Not on file  . Number of children: Not on file  . Years of education: Not on file  . Highest education level: Not on file  Occupational History  . Not on file  Tobacco Use  . Smoking status: Never Smoker  . Smokeless tobacco: Never Used  Substance and Sexual Activity  . Alcohol use: Yes    Alcohol/week: 0.0 standard drinks    Comment: occasional-social  . Drug use: No  . Sexual activity: Yes  Other Topics Concern  . Not on file  Social History Narrative  . Not on file   Social Determinants of Health   Financial Resource Strain:   . Difficulty of Paying Living Expenses: Not on file  Food Insecurity:   . Worried About Charity fundraiser in the Last Year: Not on file  . Ran Out of Food in the Last Year: Not on file  Transportation Needs:   . Lack of Transportation (Medical): Not on file  . Lack of Transportation (Non-Medical): Not on file  Physical Activity:   . Days of Exercise per Week: Not on file  . Minutes of Exercise per Session: Not  on file  Stress:   . Feeling of Stress : Not on file  Social Connections:   . Frequency of Communication with Friends and Family: Not on file  . Frequency of Social Gatherings with Friends and Family: Not on file  . Attends Religious Services: Not on file  . Active Member of Clubs or Organizations: Not on file  . Attends Archivist Meetings: Not on file  . Marital Status: Not on file  Intimate Partner Violence:   . Fear of Current or Ex-Partner: Not on file  . Emotionally Abused: Not on file  . Physically Abused: Not on file  . Sexually Abused: Not on file     Constitutional: Denies fever, malaise, fatigue, headache or abrupt weight changes.  HEENT: Denies eye pain, eye redness, ear pain, ringing in the ears, wax buildup, runny nose, nasal congestion,  bloody nose, or sore throat. Respiratory: Pt reports intermittent cough. Denies difficulty breathing, shortness of breath, cough or sputum production.   Cardiovascular: Denies chest pain, chest tightness, palpitations or swelling in the hands or feet.  Gastrointestinal: Denies abdominal pain, bloating, constipation, diarrhea or blood in the stool.  GU: Pt reports erectile dysfunction. Denies urgency, frequency, pain with urination, burning sensation, blood in urine, odor or discharge. Musculoskeletal: Denies decrease in range of motion, difficulty with gait, muscle pain or joint pain and swelling.  Skin: Denies redness, rashes, lesions or ulcercations.  Neurological: Pt reports insomnia. Denies dizziness, difficulty with memory, difficulty with speech or problems with balance and coordination.  Psych: Denies anxiety, depression, SI/HI.  No other specific complaints in a complete review of systems (except as listed in HPI above).  Objective:   Physical Exam  BP 136/84   Pulse 84   Temp 97.8 F (36.6 C) (Temporal)   Ht 5\' 7"  (1.702 m)   Wt 236 lb (107 kg)   SpO2 97%   BMI 36.96 kg/m   Wt Readings from Last 3 Encounters:  05/17/18 223 lb (101.2 kg)  01/12/18 225 lb (102.1 kg)  01/15/17 203 lb (92.1 kg)    General: Appears his stated age, obese, in NAD. Skin: Warm, dry and intact. No rashesnoted. HEENT: Head: normal shape and size; Eyes: sclera white, no icterus, conjunctiva pink, PERRLA and EOMs intact;  Neck:  Neck supple, trachea midline. No masses, lumps or thyromegaly present.  Cardiovascular: Normal rate and rhythm. S1,S2 noted.  No murmur, rubs or gallops noted. No JVD or BLE edema.  Pulmonary/Chest: Normal effort and positive vesicular breath sounds. No respiratory distress. No wheezes, rales or ronchi noted.  Abdomen: Soft and nontender. Normal bowel sounds. No distention or masses noted. Liver, spleen and kidneys non palpable. Musculoskeletal: Strength 5/5 BUE/BLE. No  difficulty with gait.  Neurological: Alert and oriented. Cranial nerves II-XII grossly intact. Coordination normal.  Psychiatric: Mood and affect normal. Behavior is normal. Judgment and thought content normal.     BMET    Component Value Date/Time   NA 139 03/08/2019 1026   K 4.1 03/08/2019 1026   CL 103 03/08/2019 1026   CO2 26 03/08/2019 1026   GLUCOSE 114 (H) 03/08/2019 1026   BUN 15 03/08/2019 1026   CREATININE 1.42 (H) 03/08/2019 1026   CALCIUM 9.3 03/08/2019 1026   GFRNONAA 57 (L) 03/08/2019 1026   GFRAA >60 03/08/2019 1026    Lipid Panel     Component Value Date/Time   CHOL 183 01/12/2018 1503   TRIG 120.0 01/12/2018 1503   HDL 45.10 01/12/2018 1503  CHOLHDL 4 01/12/2018 1503   VLDL 24.0 01/12/2018 1503   LDLCALC 114 (H) 01/12/2018 1503    CBC    Component Value Date/Time   WBC 8.2 03/08/2019 1026   RBC 4.73 03/08/2019 1026   HGB 15.1 03/08/2019 1026   HCT 44.3 03/08/2019 1026   PLT 243 03/08/2019 1026   MCV 93.7 03/08/2019 1026   MCH 31.9 03/08/2019 1026   MCHC 34.1 03/08/2019 1026   RDW 11.6 03/08/2019 1026   LYMPHSABS 1.5 03/08/2019 1026   MONOABS 0.8 03/08/2019 1026   EOSABS 0.1 03/08/2019 1026   BASOSABS 0.0 03/08/2019 1026    Hgb A1C Lab Results  Component Value Date   HGBA1C 4.6 01/12/2018            Assessment & Plan:   Preventative Health Maintenance:  He declines flu shot today Tetanus UTD Colon screening UTD Encouraged him to consume a balanced diet and exercise regimen Advised him to see an eye doctor and dentist annually Will check CBC, CMET, Lipid, A1C and PSA today  PND:  Will add Hydroxyzine 25 mg PO QHS  RTC in 1 year, sooner if needed Webb Silversmith, NP  This visit occurred during the SARS-CoV-2 public health emergency.  Safety protocols were in place, including screening questions prior to the visit, additional usage of staff PPE, and extensive cleaning of exam room while observing appropriate contact time as  indicated for disinfecting solutions.

## 2019-12-21 NOTE — Assessment & Plan Note (Signed)
Sildenafil refilled today 

## 2019-12-22 ENCOUNTER — Encounter: Payer: Self-pay | Admitting: Internal Medicine

## 2019-12-22 LAB — CBC
HCT: 41.6 % (ref 39.0–52.0)
Hemoglobin: 13.9 g/dL (ref 13.0–17.0)
MCHC: 33.3 g/dL (ref 30.0–36.0)
MCV: 97.5 fl (ref 78.0–100.0)
Platelets: 202 10*3/uL (ref 150.0–400.0)
RBC: 4.27 Mil/uL (ref 4.22–5.81)
RDW: 12.5 % (ref 11.5–15.5)
WBC: 4.7 10*3/uL (ref 4.0–10.5)

## 2019-12-22 LAB — COMPREHENSIVE METABOLIC PANEL
ALT: 28 U/L (ref 0–53)
AST: 26 U/L (ref 0–37)
Albumin: 4.3 g/dL (ref 3.5–5.2)
Alkaline Phosphatase: 81 U/L (ref 39–117)
BUN: 13 mg/dL (ref 6–23)
CO2: 28 mEq/L (ref 19–32)
Calcium: 9 mg/dL (ref 8.4–10.5)
Chloride: 103 mEq/L (ref 96–112)
Creatinine, Ser: 1.19 mg/dL (ref 0.40–1.50)
GFR: 77.48 mL/min (ref >60.00)
Glucose, Bld: 83 mg/dL (ref 70–99)
Potassium: 4.2 mEq/L (ref 3.5–5.1)
Sodium: 140 mEq/L (ref 135–145)
Total Bilirubin: 0.5 mg/dL (ref 0.2–1.2)
Total Protein: 7 g/dL (ref 6.0–8.3)

## 2019-12-22 LAB — LIPID PANEL
Cholesterol: 164 mg/dL (ref 0–200)
HDL: 30.2 mg/dL — ABNORMAL LOW (ref >39.00)
NonHDL: 133.72
Total CHOL/HDL Ratio: 5
Triglycerides: 274 mg/dL — ABNORMAL HIGH (ref 0.0–149.0)
VLDL: 54.8 mg/dL — ABNORMAL HIGH (ref 0.0–40.0)

## 2019-12-22 LAB — HEMOGLOBIN A1C: Hgb A1c MFr Bld: 4.7 % (ref 4.6–6.5)

## 2019-12-22 LAB — PSA: PSA: 4.66 ng/mL — ABNORMAL HIGH (ref 0.10–4.00)

## 2019-12-22 LAB — LDL CHOLESTEROL, DIRECT: Direct LDL: 99 mg/dL

## 2020-02-21 ENCOUNTER — Encounter: Payer: Self-pay | Admitting: Internal Medicine

## 2020-02-21 ENCOUNTER — Other Ambulatory Visit: Payer: Self-pay | Admitting: Internal Medicine

## 2020-02-22 MED ORDER — OMEPRAZOLE 40 MG PO CPDR: 40.0000 mg | DELAYED_RELEASE_CAPSULE | Freq: Every day | ORAL | 2 refills | Status: DC

## 2020-02-22 MED ORDER — FEXOFENADINE HCL 180 MG PO TABS: 180.0000 mg | ORAL_TABLET | Freq: Every day | ORAL | 2 refills | Status: DC

## 2020-05-02 ENCOUNTER — Other Ambulatory Visit: Payer: Self-pay | Admitting: Internal Medicine

## 2020-05-02 ENCOUNTER — Encounter: Payer: Self-pay | Admitting: Internal Medicine

## 2020-05-03 ENCOUNTER — Other Ambulatory Visit: Payer: Self-pay | Admitting: Internal Medicine

## 2020-05-03 MED ORDER — CETIRIZINE HCL 10 MG PO TABS: 10.0000 mg | ORAL_TABLET | Freq: Every day | ORAL | 0 refills | Status: DC

## 2020-05-25 DIAGNOSIS — S0501XA Injury of conjunctiva and corneal abrasion without foreign body, right eye, initial encounter: Secondary | ICD-10-CM | POA: Diagnosis not present

## 2020-08-08 DIAGNOSIS — S42302A Unspecified fracture of shaft of humerus, left arm, initial encounter for closed fracture: Secondary | ICD-10-CM | POA: Diagnosis not present

## 2020-08-08 DIAGNOSIS — S4992XA Unspecified injury of left shoulder and upper arm, initial encounter: Secondary | ICD-10-CM | POA: Diagnosis not present

## 2020-08-08 DIAGNOSIS — M25412 Effusion, left shoulder: Secondary | ICD-10-CM | POA: Diagnosis not present

## 2020-08-08 DIAGNOSIS — M25512 Pain in left shoulder: Secondary | ICD-10-CM | POA: Diagnosis not present

## 2020-08-08 DIAGNOSIS — W19XXXA Unspecified fall, initial encounter: Secondary | ICD-10-CM | POA: Diagnosis not present

## 2020-08-08 DIAGNOSIS — M791 Myalgia, unspecified site: Secondary | ICD-10-CM | POA: Diagnosis not present

## 2020-08-08 DIAGNOSIS — S99912A Unspecified injury of left ankle, initial encounter: Secondary | ICD-10-CM | POA: Diagnosis not present

## 2020-08-08 DIAGNOSIS — S99921A Unspecified injury of right foot, initial encounter: Secondary | ICD-10-CM | POA: Diagnosis not present

## 2020-08-08 DIAGNOSIS — S42142A Displaced fracture of glenoid cavity of scapula, left shoulder, initial encounter for closed fracture: Secondary | ICD-10-CM | POA: Diagnosis not present

## 2020-08-08 DIAGNOSIS — R936 Abnormal findings on diagnostic imaging of limbs: Secondary | ICD-10-CM | POA: Diagnosis not present

## 2020-08-08 DIAGNOSIS — S4292XA Fracture of left shoulder girdle, part unspecified, initial encounter for closed fracture: Secondary | ICD-10-CM | POA: Diagnosis not present

## 2020-08-08 DIAGNOSIS — S99922A Unspecified injury of left foot, initial encounter: Secondary | ICD-10-CM | POA: Diagnosis not present

## 2020-08-09 DIAGNOSIS — M25412 Effusion, left shoulder: Secondary | ICD-10-CM | POA: Diagnosis not present

## 2020-08-09 DIAGNOSIS — S42142A Displaced fracture of glenoid cavity of scapula, left shoulder, initial encounter for closed fracture: Secondary | ICD-10-CM | POA: Diagnosis not present

## 2020-08-09 DIAGNOSIS — S99921A Unspecified injury of right foot, initial encounter: Secondary | ICD-10-CM | POA: Diagnosis not present

## 2020-08-16 DIAGNOSIS — W1839XA Other fall on same level, initial encounter: Secondary | ICD-10-CM | POA: Diagnosis not present

## 2020-08-16 DIAGNOSIS — W228XXA Striking against or struck by other objects, initial encounter: Secondary | ICD-10-CM | POA: Diagnosis not present

## 2020-08-16 DIAGNOSIS — S42142A Displaced fracture of glenoid cavity of scapula, left shoulder, initial encounter for closed fracture: Secondary | ICD-10-CM | POA: Diagnosis not present

## 2020-08-16 DIAGNOSIS — S42152A Displaced fracture of neck of scapula, left shoulder, initial encounter for closed fracture: Secondary | ICD-10-CM | POA: Diagnosis not present

## 2020-08-16 DIAGNOSIS — S42292A Other displaced fracture of upper end of left humerus, initial encounter for closed fracture: Secondary | ICD-10-CM | POA: Diagnosis not present

## 2020-08-16 DIAGNOSIS — M25512 Pain in left shoulder: Secondary | ICD-10-CM | POA: Diagnosis not present

## 2020-08-30 DIAGNOSIS — S42152D Displaced fracture of neck of scapula, left shoulder, subsequent encounter for fracture with routine healing: Secondary | ICD-10-CM | POA: Diagnosis not present

## 2020-08-30 DIAGNOSIS — X58XXXD Exposure to other specified factors, subsequent encounter: Secondary | ICD-10-CM | POA: Diagnosis not present

## 2020-08-30 DIAGNOSIS — S42142D Displaced fracture of glenoid cavity of scapula, left shoulder, subsequent encounter for fracture with routine healing: Secondary | ICD-10-CM | POA: Diagnosis not present

## 2020-10-04 DIAGNOSIS — W19XXXA Unspecified fall, initial encounter: Secondary | ICD-10-CM | POA: Diagnosis not present

## 2020-10-04 DIAGNOSIS — S42142A Displaced fracture of glenoid cavity of scapula, left shoulder, initial encounter for closed fracture: Secondary | ICD-10-CM | POA: Diagnosis not present

## 2020-10-04 DIAGNOSIS — M25512 Pain in left shoulder: Secondary | ICD-10-CM | POA: Diagnosis not present

## 2020-10-04 DIAGNOSIS — S42152A Displaced fracture of neck of scapula, left shoulder, initial encounter for closed fracture: Secondary | ICD-10-CM | POA: Diagnosis not present

## 2020-10-04 DIAGNOSIS — Y9289 Other specified places as the place of occurrence of the external cause: Secondary | ICD-10-CM | POA: Diagnosis not present

## 2020-10-04 DIAGNOSIS — S4292XD Fracture of left shoulder girdle, part unspecified, subsequent encounter for fracture with routine healing: Secondary | ICD-10-CM | POA: Diagnosis not present

## 2020-10-22 IMAGING — CT CT ANGIO CHEST-ABD-PELV FOR DISSECTION W/ AND WO/W CM
2 of 7 series · 14 of 46 positions shown, 16 images · IV contrast (iopamidol)
Comparison: Chest x-ray obtained earlier today

CLINICAL DATA: 52-year-old male with gassiness, constipation and
chest pain

EXAM:
CT ANGIOGRAPHY CHEST, ABDOMEN AND PELVIS
TECHNIQUE: Multidetector CT imaging through the chest, abdomen and pelvis was
performed using the standard protocol during bolus administration of
intravenous contrast. Multiplanar reconstructed images and MIPs were
obtained and reviewed to evaluate the vascular anatomy.
CONTRAST:  100mL L2542R-Q6G IOPAMIDOL (L2542R-Q6G) INJECTION 76%

[Series 5: axial arterial · axial · arterial · 0.80mm/px · z∈[+290,+839]mm · 11 of 209 slices shown, 13 images]
[im 13/209  soft-tissue]
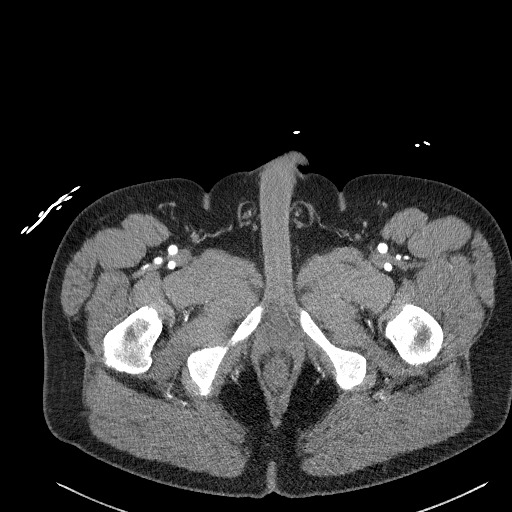
[im 13/209  bone]
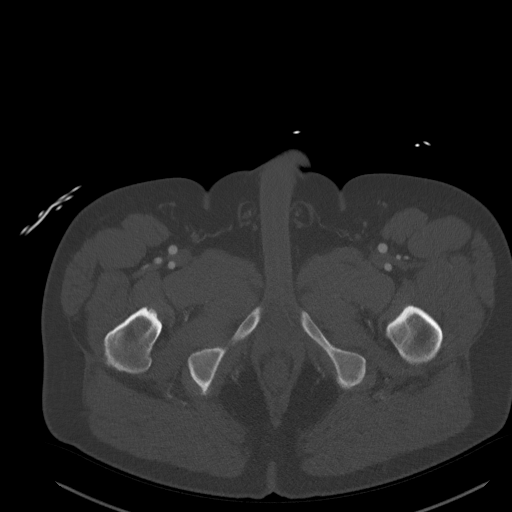
[im 37/209  soft-tissue]
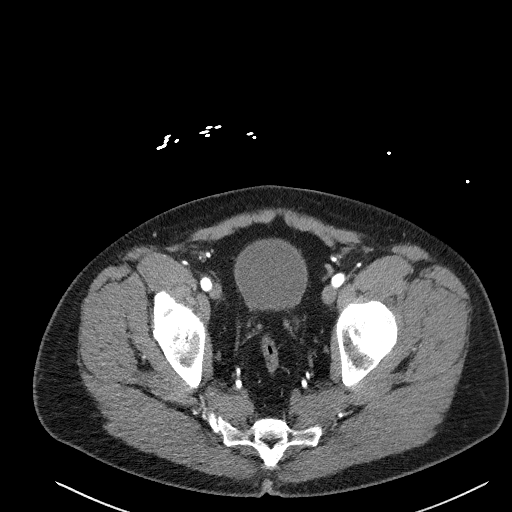
[im 49/209  soft-tissue]
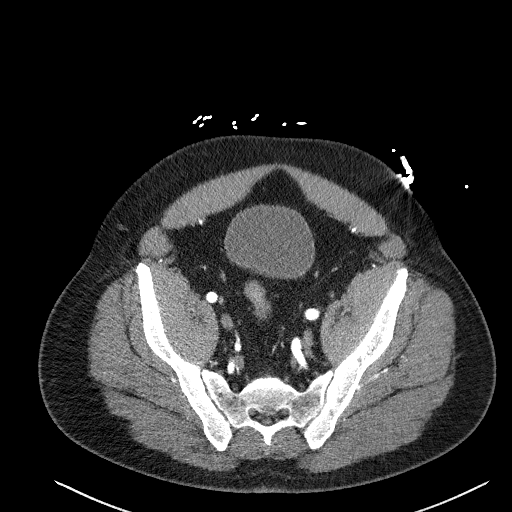
[im 74/209  soft-tissue]
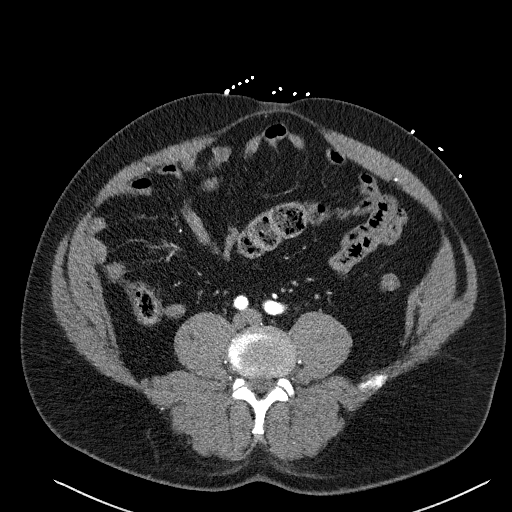
[im 86/209  soft-tissue]
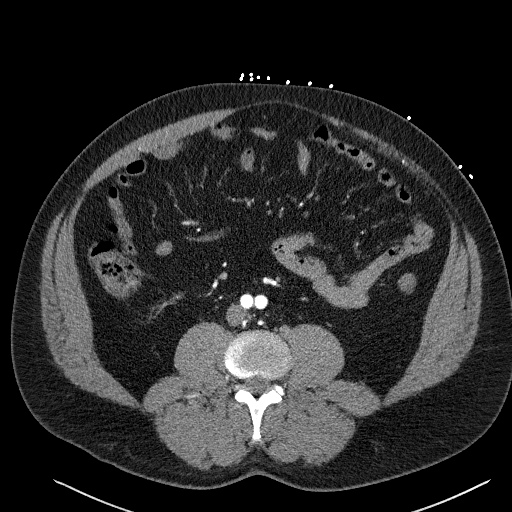
[im 111/209  soft-tissue]
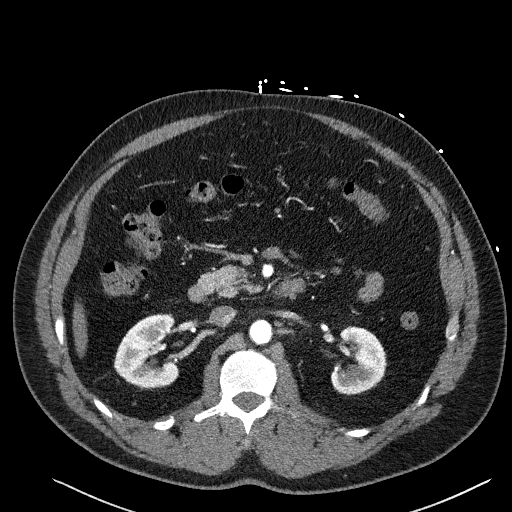
[im 123/209  soft-tissue]
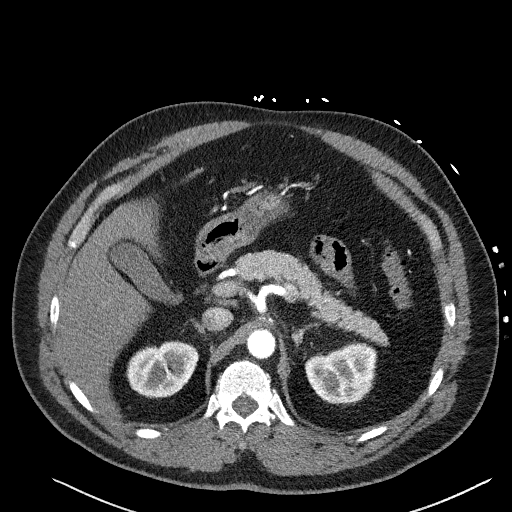
[im 135/209  soft-tissue]
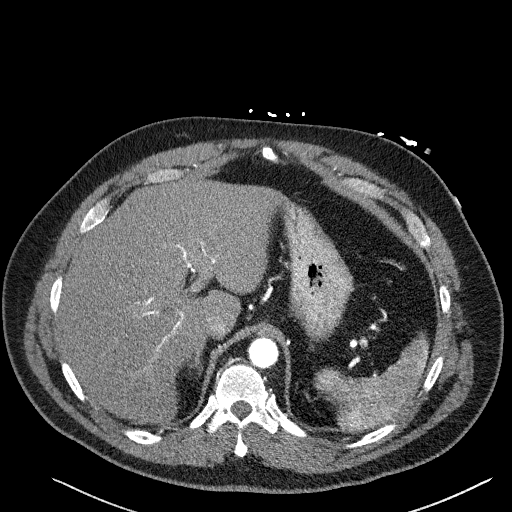
[im 160/209  soft-tissue]
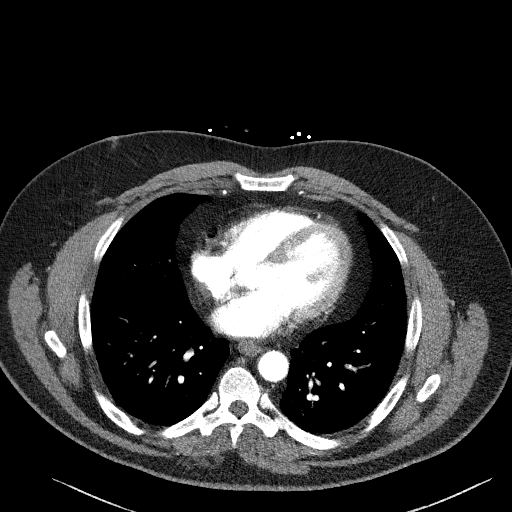
[im 160/209  bone]
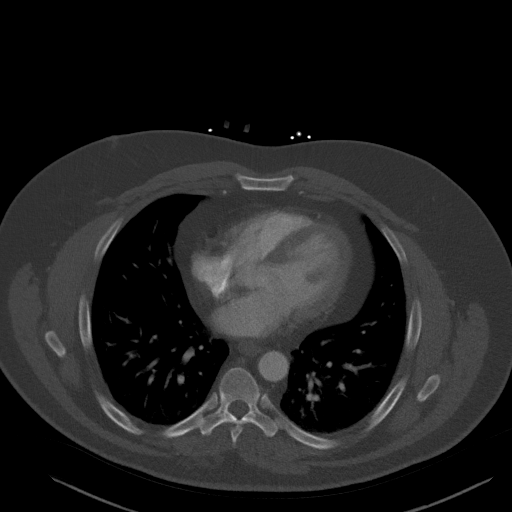
[im 172/209  soft-tissue]
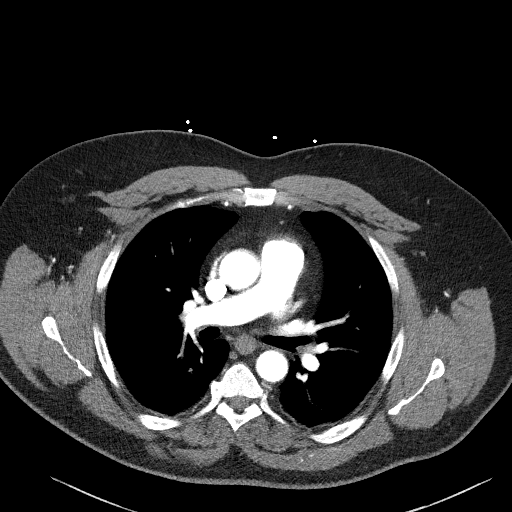
[im 196/209  soft-tissue]
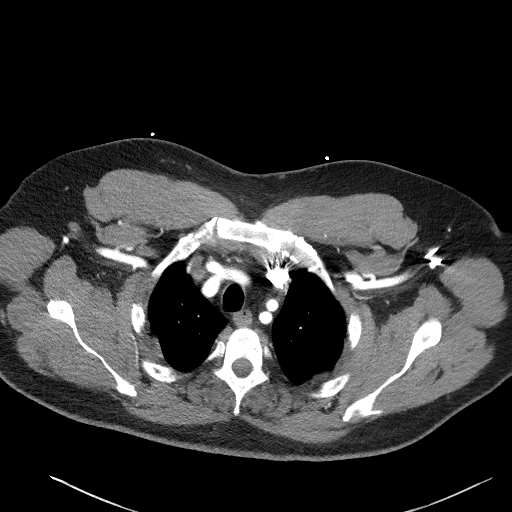

[Series 8: coronals · coronal · 0.88mm/px · 3 of 170 slices shown]
[im 43/170  soft-tissue]
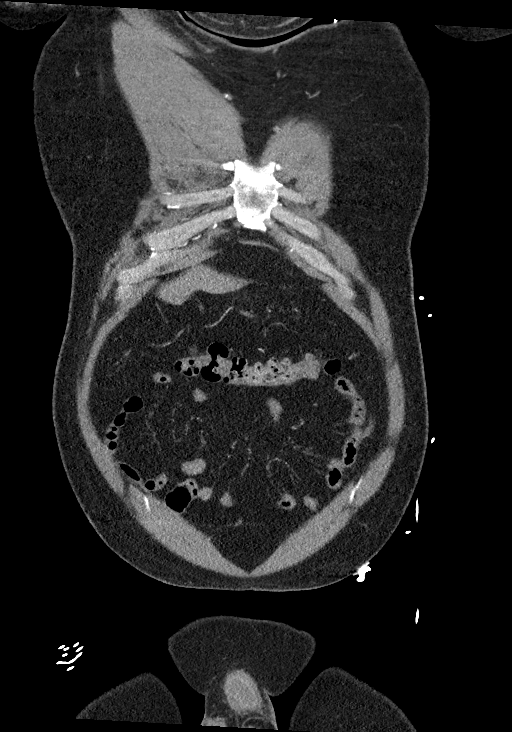
[im 85/170  soft-tissue]
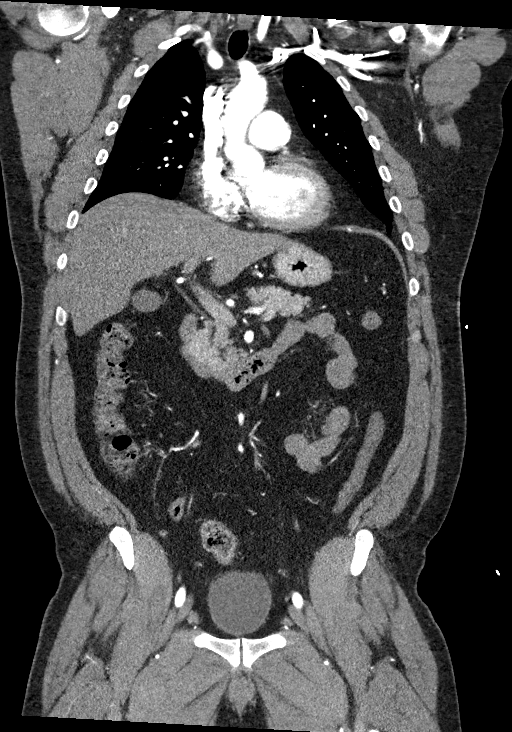
[im 127/170  soft-tissue]
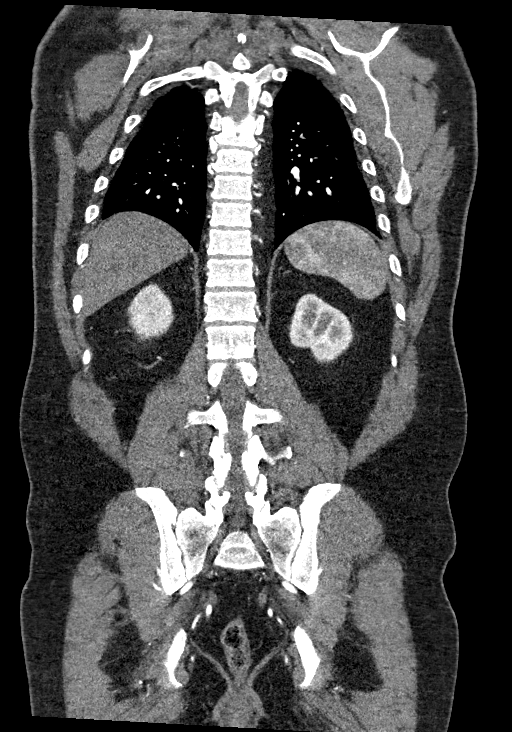

[14 of 46 positions shown; findings below may reference images not displayed]

FINDINGS: CTA CHEST FINDINGS

Cardiovascular: Initial unenhanced CT imaging of the chest
demonstrates no evidence of acute intramural hematoma. Following
administration of intravenous contrast there is excellent
opacification of the thoracic aorta which is normal in caliber and
without evidence of dissection. Two vessel aortic arch. The right
brachiocephalic and left common carotid artery share a common
origin. Pulmonary arteries are also relatively well opacified. No
evidence of central or lobar pulmonary embolus. The heart is normal
in size. No pericardial effusion.

Mediastinum/Nodes: Unremarkable CT appearance of the thyroid gland.
No suspicious mediastinal or hilar adenopathy. No soft tissue
mediastinal mass. The thoracic esophagus is unremarkable.

Lungs/Pleura: Lungs are clear. No pleural effusion or pneumothorax.

Musculoskeletal: No acute fracture or aggressive appearing lytic or
blastic osseous lesion.

Review of the MIP images confirms the above findings.

CTA ABDOMEN AND PELVIS FINDINGS

VASCULAR

Aorta: Normal caliber aorta without aneurysm, dissection, vasculitis
or significant stenosis.

Celiac: Patent without evidence of aneurysm, dissection, vasculitis
or significant stenosis. The lateral segmental branch of the left
hepatic artery is replaced to the left gastric artery.

SMA: Patent without evidence of aneurysm, dissection, vasculitis or
significant stenosis.

Renals: Both renal arteries are patent without evidence of aneurysm,
dissection, vasculitis, fibromuscular dysplasia or significant
stenosis.

IMA: Patent without evidence of aneurysm, dissection, vasculitis or
significant stenosis.

Inflow: Patent without evidence of aneurysm, dissection, vasculitis
or significant stenosis.

Veins: No obvious venous abnormality within the limitations of this
arterial phase study.

Review of the MIP images confirms the above findings.

NON-VASCULAR

Hepatobiliary: No focal liver abnormality is seen. No gallstones,
gallbladder wall thickening, or biliary dilatation.

Pancreas: Unremarkable. No pancreatic ductal dilatation or
surrounding inflammatory changes.

Spleen: Normal in size without focal abnormality.

Adrenals/Urinary Tract: Adrenal glands are unremarkable. Kidneys are
normal, without renal calculi, focal lesion, or hydronephrosis.
Bladder is unremarkable.

Stomach/Bowel: Stomach is within normal limits. Appendix appears
normal. No evidence of bowel wall thickening, distention, or
inflammatory changes.

Lymphatic: No significant vascular findings are present. No enlarged
abdominal or pelvic lymph nodes.

Reproductive: Prostate is unremarkable.

Other: No ascites.  Fat containing umbilical hernia.

Musculoskeletal: No acute fracture or aggressive appearing lytic or
blastic osseous lesion.

Review of the MIP images confirms the above findings.
IMPRESSION: 1. No evidence of acute thoracic or abdominal aortic dissection or
other acute vascular abnormality.
2. No evidence of acute cardiopulmonary process.
3. No evidence of acute abnormality within the abdomen or pelvis.
4. Small fat containing umbilical hernia.
5. Incidental note is made of a replaced left hepatic artery.

## 2020-10-22 IMAGING — DX ABDOMEN - 2 VIEW
3 series · 3 of 3 positions shown · non-contrast
Comparison: None.

CLINICAL DATA: Abdominal pressure with increased urination for 1
week. Bloating.

EXAM:
ABDOMEN - 2 VIEW

[abdomen erect]
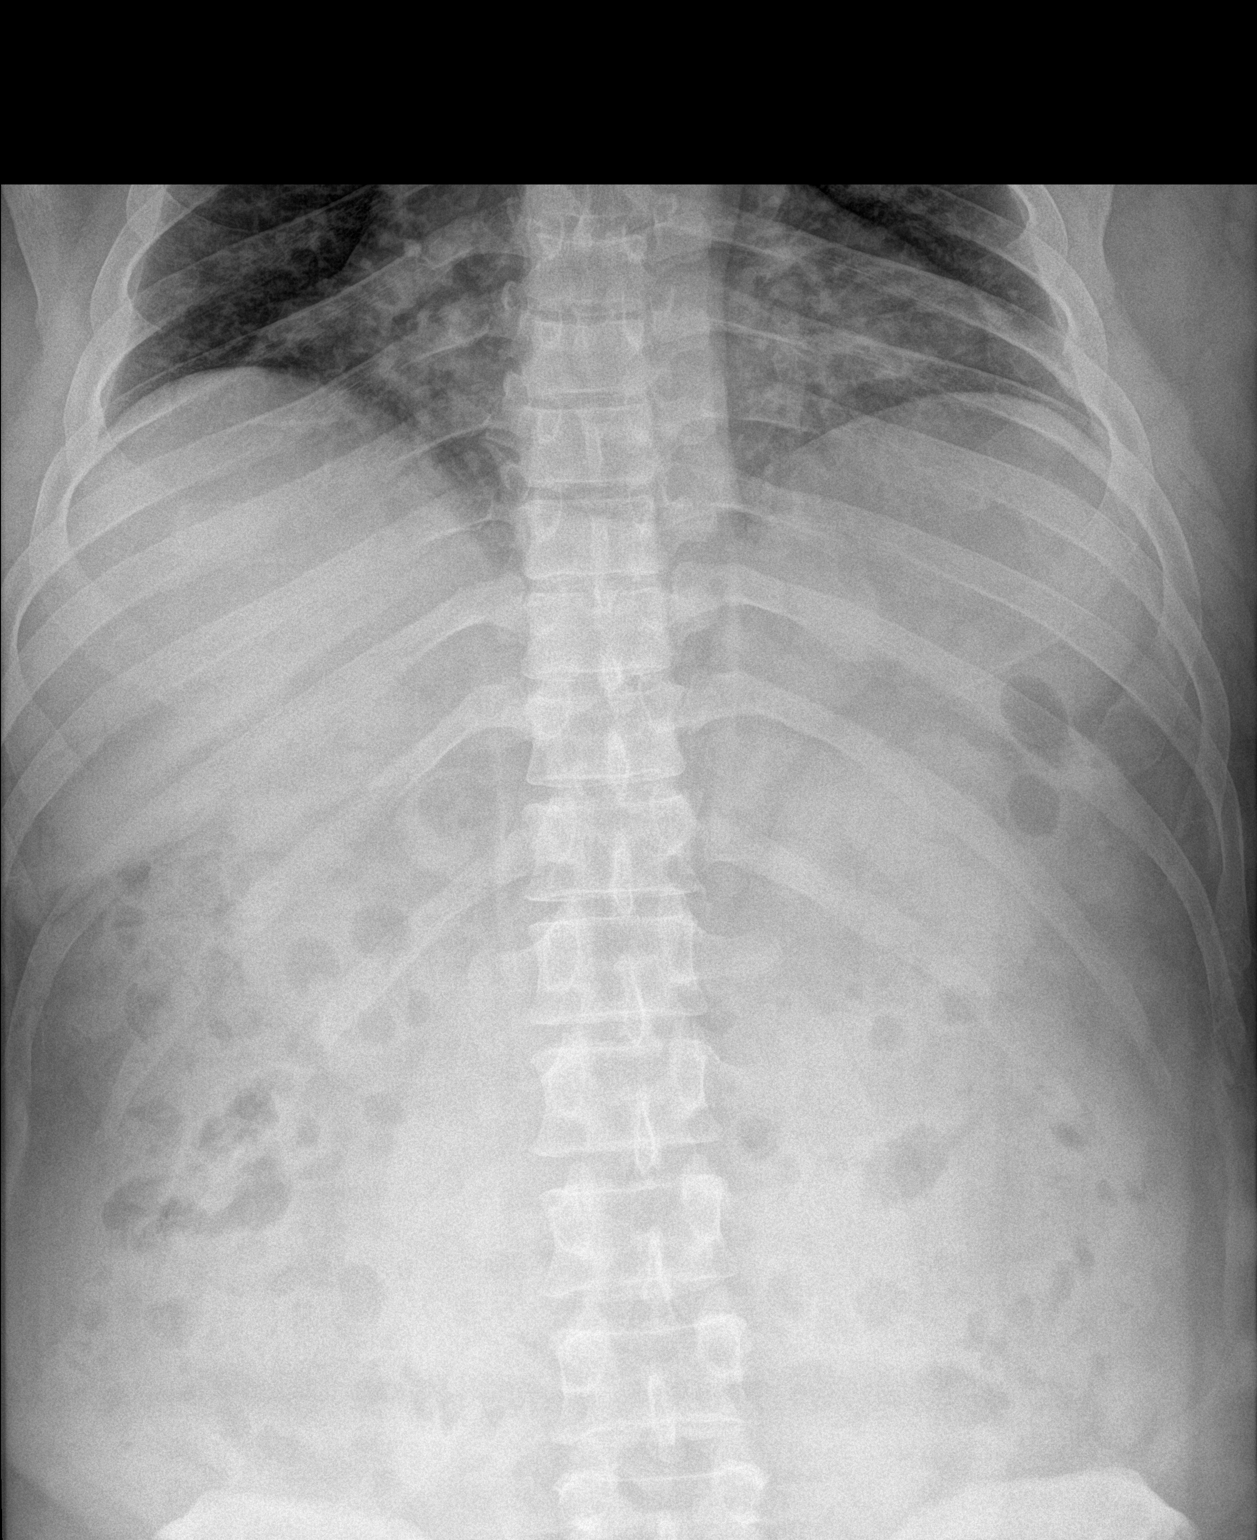

[abdomen supine (1 of 2)]
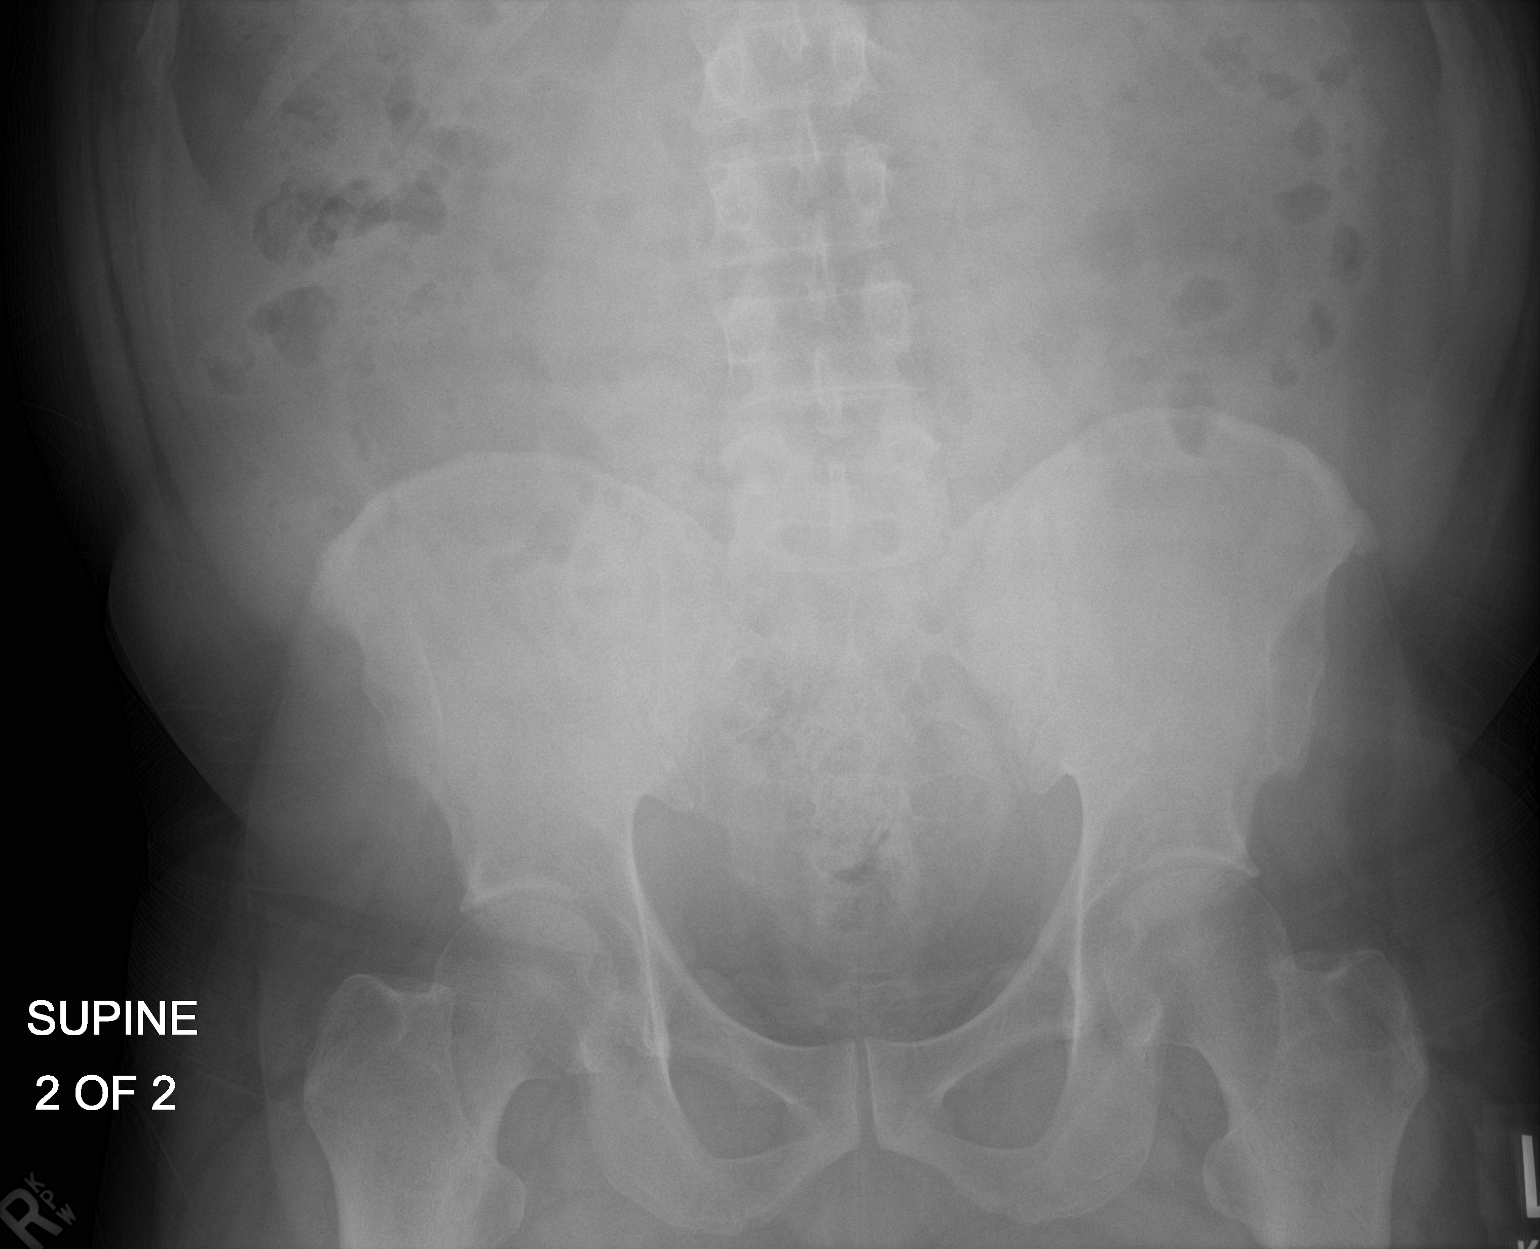

[abdomen supine (2 of 2)]
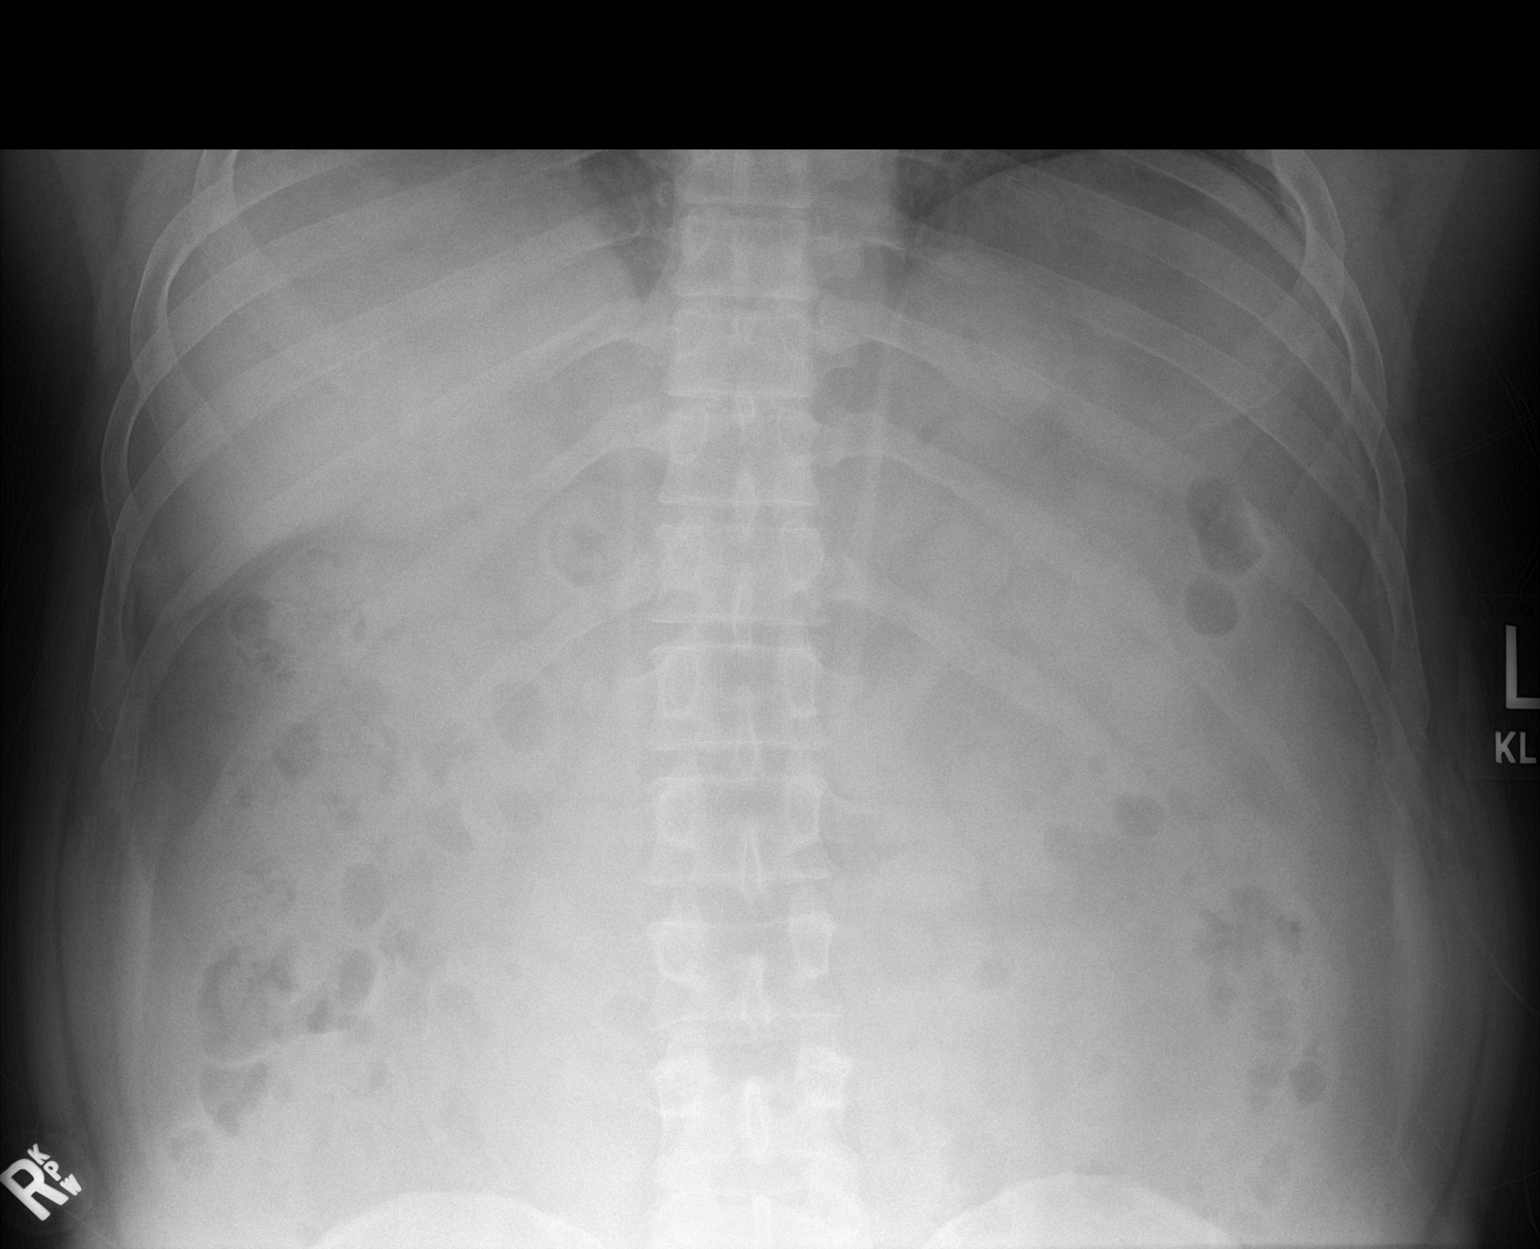

[3 of 3 positions shown; findings below may reference images not displayed]

FINDINGS: Scattered gas and stool are present in nondilated colon. There is
also scattered small bowel gas. No dilated loops of bowel are seen
to suggest obstruction. No abnormal soft tissue calcification or
acute osseous abnormality is seen. The visualized lung bases are
grossly clear.
IMPRESSION: Negative.

## 2023-02-03 ENCOUNTER — Encounter: Payer: Self-pay | Admitting: Nurse Practitioner

## 2023-02-03 ENCOUNTER — Ambulatory Visit (INDEPENDENT_AMBULATORY_CARE_PROVIDER_SITE_OTHER): Payer: BC Managed Care – PPO | Admitting: Nurse Practitioner

## 2023-02-03 ENCOUNTER — Other Ambulatory Visit: Payer: Self-pay

## 2023-02-03 ENCOUNTER — Telehealth: Payer: Self-pay

## 2023-02-03 VITALS — BP 132/82 | HR 90 | Temp 98.2°F | Resp 16 | Ht 67.25 in | Wt 239.5 lb

## 2023-02-03 DIAGNOSIS — E669 Obesity, unspecified: Secondary | ICD-10-CM

## 2023-02-03 DIAGNOSIS — Z125 Encounter for screening for malignant neoplasm of prostate: Secondary | ICD-10-CM

## 2023-02-03 DIAGNOSIS — K429 Umbilical hernia without obstruction or gangrene: Secondary | ICD-10-CM

## 2023-02-03 DIAGNOSIS — Z86010 Personal history of colonic polyps: Secondary | ICD-10-CM

## 2023-02-03 DIAGNOSIS — Z0000 Encounter for general adult medical examination without abnormal findings: Secondary | ICD-10-CM | POA: Diagnosis not present

## 2023-02-03 DIAGNOSIS — N529 Male erectile dysfunction, unspecified: Secondary | ICD-10-CM | POA: Diagnosis not present

## 2023-02-03 DIAGNOSIS — Z23 Encounter for immunization: Secondary | ICD-10-CM

## 2023-02-03 DIAGNOSIS — J302 Other seasonal allergic rhinitis: Secondary | ICD-10-CM | POA: Diagnosis not present

## 2023-02-03 DIAGNOSIS — K219 Gastro-esophageal reflux disease without esophagitis: Secondary | ICD-10-CM

## 2023-02-03 DIAGNOSIS — Z1211 Encounter for screening for malignant neoplasm of colon: Secondary | ICD-10-CM

## 2023-02-03 LAB — COMPREHENSIVE METABOLIC PANEL
ALT: 27 U/L (ref 0–53)
AST: 21 U/L (ref 0–37)
Albumin: 4.3 g/dL (ref 3.5–5.2)
Alkaline Phosphatase: 79 U/L (ref 39–117)
BUN: 15 mg/dL (ref 6–23)
CO2: 28 mEq/L (ref 19–32)
Calcium: 9.8 mg/dL (ref 8.4–10.5)
Chloride: 102 mEq/L (ref 96–112)
Creatinine, Ser: 1.29 mg/dL (ref 0.40–1.50)
GFR: 62.3 mL/min (ref >60.00)
Glucose, Bld: 93 mg/dL (ref 70–99)
Potassium: 4.2 mEq/L (ref 3.5–5.1)
Sodium: 138 mEq/L (ref 135–145)
Total Bilirubin: 0.9 mg/dL (ref 0.2–1.2)
Total Protein: 7.3 g/dL (ref 6.0–8.3)

## 2023-02-03 LAB — CBC
HCT: 43.2 % (ref 39.0–52.0)
Hemoglobin: 14.6 g/dL (ref 13.0–17.0)
MCHC: 33.9 g/dL (ref 30.0–36.0)
MCV: 97.6 fl (ref 78.0–100.0)
Platelets: 238 10*3/uL (ref 150.0–400.0)
RBC: 4.43 Mil/uL (ref 4.22–5.81)
RDW: 12.6 % (ref 11.5–15.5)
WBC: 6.3 10*3/uL (ref 4.0–10.5)

## 2023-02-03 LAB — HEMOGLOBIN A1C: Hgb A1c MFr Bld: 4.8 % (ref 4.6–6.5)

## 2023-02-03 LAB — LIPID PANEL
Cholesterol: 187 mg/dL (ref 0–200)
HDL: 54.1 mg/dL (ref >39.00)
LDL Cholesterol: 105 mg/dL — ABNORMAL HIGH (ref 0–99)
NonHDL: 132.96
Total CHOL/HDL Ratio: 3
Triglycerides: 138 mg/dL (ref 0.0–149.0)
VLDL: 27.6 mg/dL (ref 0.0–40.0)

## 2023-02-03 LAB — TSH: TSH: 1.9 u[IU]/mL (ref 0.35–5.50)

## 2023-02-03 LAB — PSA: PSA: 6.69 ng/mL — ABNORMAL HIGH (ref 0.10–4.00)

## 2023-02-03 MED ORDER — CETIRIZINE HCL 10 MG PO TABS: 10.0000 mg | ORAL_TABLET | Freq: Every day | ORAL | 3 refills | Status: DC

## 2023-02-03 MED ORDER — NA SULFATE-K SULFATE-MG SULF 17.5-3.13-1.6 GM/177ML PO SOLN: 1.0000 | Freq: Once | ORAL | 0 refills | Status: AC

## 2023-02-03 MED ORDER — SILDENAFIL CITRATE 100 MG PO TABS: 100.0000 mg | ORAL_TABLET | Freq: Every day | ORAL | 5 refills | Status: DC | PRN

## 2023-02-03 NOTE — Patient Instructions (Signed)
Nice to see you today I will be in touch with the labs once I have the results Follow up with me in 1 year, sooner if you need me  Work on starting to exercise. The chair exercises are a good starting point  We gave you your first shingles vaccine today. You will need to come back in 3 months for a nruse visit and get the second and final dose

## 2023-02-03 NOTE — Assessment & Plan Note (Signed)
Incidental finding on exam we will practice watchful waiting.  Signs and symptoms reviewed when to be seen emergently

## 2023-02-03 NOTE — Assessment & Plan Note (Signed)
Patient relates that not taking any medications controlled with diet and lifestyle modifications currently

## 2023-02-03 NOTE — Telephone Encounter (Signed)
Gastroenterology Pre-Procedure Review  Request Date: 03/19/23 Requesting Physician: Dr. Vicente Males  PATIENT REVIEW QUESTIONS: The patient responded to the following health history questions as indicated:    1. Are you having any GI issues? no 2. Do you have a personal history of Polyps? yes (last colonoscopy performed by dr. Vicente Males 01/15/17) 3. Do you have a family history of Colon Cancer or Polyps? yes (maternal uncle colon cancer) 4. Diabetes Mellitus? no 5. Joint replacements in the past 12 months?no 6. Major health problems in the past 3 months?no 7. Any artificial heart valves, MVP, or defibrillator?no    MEDICATIONS & ALLERGIES:    Patient reports the following regarding taking any anticoagulation/antiplatelet therapy:   Plavix, Coumadin, Eliquis, Xarelto, Lovenox, Pradaxa, Brilinta, or Effient? no Aspirin? no  Patient confirms/reports the following medications:  Current Outpatient Medications  Medication Sig Dispense Refill   albuterol (VENTOLIN HFA) 108 (90 Base) MCG/ACT inhaler Inhale 2 puffs into the lungs every 4 (four) hours as needed for wheezing.     cetirizine (ZYRTEC) 10 MG tablet Take 1 tablet (10 mg total) by mouth daily. 90 tablet 3   moxifloxacin (VIGAMOX) 0.5 % ophthalmic solution Place 1 drop into both eyes 3 (three) times daily.     sildenafil (VIAGRA) 100 MG tablet Take 1 tablet (100 mg total) by mouth daily as needed for erectile dysfunction. 10 tablet 5   No current facility-administered medications for this visit.    Patient confirms/reports the following allergies:  No Known Allergies  No orders of the defined types were placed in this encounter.   AUTHORIZATION INFORMATION Primary Insurance: 1D#: Group #:  Secondary Insurance: 1D#: Group #:  SCHEDULE INFORMATION: Date: 03/19/23 Time: Location: [facility]

## 2023-02-03 NOTE — Assessment & Plan Note (Signed)
Discussed age-appropriate immunizations and screening exams.  Patient is up-to-date on tetanus vaccine, refused flu vaccine, updated shingles vaccine today.  Ambulatory referral placed for CRC screening patient is overdue.  PSA ordered today for prostate cancer screening last 1 was elevated.  Patient was given information at discharge about preventative healthcare maintenance with anticipatory guidance

## 2023-02-03 NOTE — Assessment & Plan Note (Signed)
Patient currently takes Zyrtec daily refills provided

## 2023-02-03 NOTE — Assessment & Plan Note (Signed)
Patient has difficulty maintaining erection but cannot obtain.  States he has used sildenafil in the past with good result and no adverse drug events.  Refills provided today

## 2023-02-03 NOTE — Progress Notes (Signed)
Established Patient Office Visit  Subjective   Patient ID: Victor Carter, male    DOB: Oct 21, 1966  Age: 57 y.o. MRN: [id number]  Chief Complaint  Patient presents with   Establish Care    HPI  for complete physical and follow up of chronic conditions.  GERD: Patient currently maintained without medication on lifestyle modifications only.  Seasonal allergies: Patient states that he will take Zyrtec daily and that seems to help.  Patient requesting prescription  ED: states that he has can get an erection but not maintained. States that he took the Viagra and did well. States that he had no side effects. States half a tab seems to work    Immunizations: -Tetanus: Completed in 2016 -Influenza: refused -Shingles: Get the first one today -Pneumonia: too young   -HPV: aged out  Diet: Obion. States that he is doing 2.5 meals a day. States small breakfast. States that he drinks coffee and fruit/vitamin drink  Exercise: No regular exercise.   Eye exam:  prescription glasses prn. Done this year  Dental exam: Completes semi-annually    Colonoscopy: Completed in 2018, recall in 3 years. Due 2021 Lung Cancer Screening: NA  Dexa: NA  PSA: Due last one onfile is elevated  Sleep: States that he goes to bed around 11 and will get up at 450. Sometimes he feels rested. Does snore        Review of Systems  Constitutional:  Negative for chills and fever.  Respiratory:  Negative for shortness of breath.   Cardiovascular:  Negative for chest pain and leg swelling.  Gastrointestinal:  Negative for abdominal pain, blood in stool, constipation, diarrhea, nausea and vomiting.       Bm daily   Genitourinary:  Negative for dysuria and hematuria.  Neurological:  Negative for tingling and headaches.  Psychiatric/Behavioral:  Negative for hallucinations and suicidal ideas.       Objective:     BP 132/82   Pulse 90   Temp 98.2 F (36.8 C)   Resp 16   Ht 5' 7.25" (1.708 m)    Wt 239 lb 8 oz (108.6 kg)   SpO2 95%   BMI 37.23 kg/m  BP Readings from Last 3 Encounters:  02/03/23 132/82  12/21/19 136/84  03/08/19 (!) 157/100   Wt Readings from Last 3 Encounters:  02/03/23 239 lb 8 oz (108.6 kg)  12/21/19 236 lb (107 kg)  05/17/18 223 lb (101.2 kg)      Physical Exam Vitals and nursing note reviewed.  Constitutional:      Appearance: Normal appearance.  HENT:     Right Ear: Tympanic membrane, ear canal and external ear normal.     Left Ear: Tympanic membrane, ear canal and external ear normal.     Mouth/Throat:     Mouth: Mucous membranes are moist.     Pharynx: Oropharynx is clear.  Eyes:     Extraocular Movements: Extraocular movements intact.     Pupils: Pupils are equal, round, and reactive to light.  Cardiovascular:     Rate and Rhythm: Normal rate and regular rhythm.     Pulses: Normal pulses.     Heart sounds: Normal heart sounds.  Pulmonary:     Effort: Pulmonary effort is normal.     Breath sounds: Normal breath sounds.  Abdominal:     General: Bowel sounds are normal. There is no distension.     Palpations: There is no mass.     Tenderness: There is  no abdominal tenderness.     Hernia: A hernia is present.    Genitourinary:    Comments: Deferred per patient  Musculoskeletal:     Right lower leg: No edema.     Left lower leg: No edema.  Lymphadenopathy:     Cervical: No cervical adenopathy.  Skin:    General: Skin is warm.  Neurological:     General: No focal deficit present.     Mental Status: He is alert.     Deep Tendon Reflexes:     Reflex Scores:      Bicep reflexes are 2+ on the right side and 2+ on the left side.      Patellar reflexes are 2+ on the right side and 2+ on the left side.    Comments: Bilateral upper and lower extremity strength 5/5  Psychiatric:        Mood and Affect: Mood normal.        Behavior: Behavior normal.        Thought Content: Thought content normal.        Judgment: Judgment normal.       No results found for any visits on 02/03/23.    The ASCVD Risk score (Arnett DK, et al., 2019) failed to calculate for the following reasons:   Cannot find a previous HDL lab   Cannot find a previous total cholesterol lab    Assessment & Plan:   Problem List Items Addressed This Visit       Digestive   Gastroesophageal reflux disease    Patient relates that not taking any medications controlled with diet and lifestyle modifications currently        Other   Erectile dysfunction    Patient has difficulty maintaining erection but cannot obtain.  States he has used sildenafil in the past with good result and no adverse drug events.  Refills provided today      Relevant Medications   sildenafil (VIAGRA) 100 MG tablet   Preventative health care - Primary    Discussed age-appropriate immunizations and screening exams.  Patient is up-to-date on tetanus vaccine, refused flu vaccine, updated shingles vaccine today.  Ambulatory referral placed for CRC screening patient is overdue.  PSA ordered today for prostate cancer screening last 1 was elevated.  Patient was given information at discharge about preventative healthcare maintenance with anticipatory guidance      Relevant Orders   CBC   Lipid panel   Comprehensive metabolic panel   Hemoglobin A1c   TSH   Obesity (BMI 30-39.9)    Did discuss healthy lifestyle modifications inclusive of exercise 30 minutes a day 5 times a week.  Patient is slow 10 to 15 minutes 2 times a week.  Patient is interested in doing seated chair exercises      Relevant Orders   Lipid panel   Hemoglobin A1c   TSH   Seasonal allergies    Patient currently takes Zyrtec daily refills provided      Relevant Medications   cetirizine (ZYRTEC) 10 MG tablet   Umbilical hernia without obstruction and without gangrene    Incidental finding on exam we will practice watchful waiting.  Signs and symptoms reviewed when to be seen emergently      Other  Visit Diagnoses     Screening for prostate cancer       Relevant Orders   PSA   Screening for colon cancer       Relevant Orders   Ambulatory  referral to Gastroenterology   Need for shingles vaccine       Relevant Orders   Zoster Recombinant (Shingrix ) (Completed)       Return in about 1 year (around 02/04/2024) for CPE and Labs.    Romilda Garret, NP

## 2023-02-03 NOTE — Assessment & Plan Note (Signed)
Did discuss healthy lifestyle modifications inclusive of exercise 30 minutes a day 5 times a week.  Patient is slow 10 to 15 minutes 2 times a week.  Patient is interested in doing seated chair exercises

## 2023-02-04 ENCOUNTER — Telehealth: Payer: Self-pay | Admitting: Nurse Practitioner

## 2023-02-04 DIAGNOSIS — R972 Elevated prostate specific antigen [PSA]: Secondary | ICD-10-CM

## 2023-02-04 NOTE — Telephone Encounter (Signed)
Patient called back about message below:   Barkley Bruns, [facility] 02/04/2023 11:50 AM EST Back to Top    Left message to return call to our office.   Michela Pitcher, NP 02/04/2023  7:32 AM EST     Notified via My Chart   Let me know which urology location he would like to see    Victor Carter   I have reviewed your labs   Liver, kidneys, electrolytes, thyroid, A1C (sugar level check over a 3 month period), red and white blood cells look good. Your prostate number has went even higher since the last time it was checked. I would recommend you seeing a urologist. We have them in [place] or [place], let me know which location you prefer. Let me know if you have any questions or concerns   Romilda Garret, DNP, AGNP-C   I let him know what Matt's message said and he said he would like it to be one in [place]. He had no other questions

## 2023-02-04 NOTE — Telephone Encounter (Signed)
Referral placed.

## 2023-02-08 HISTORY — PX: OTHER SURGICAL HISTORY: SHX169

## 2023-02-12 ENCOUNTER — Ambulatory Visit (INDEPENDENT_AMBULATORY_CARE_PROVIDER_SITE_OTHER): Payer: BC Managed Care – PPO | Admitting: Urology

## 2023-02-12 ENCOUNTER — Other Ambulatory Visit
Admission: RE | Admit: 2023-02-12 | Discharge: 2023-02-12 | Disposition: A | Discharge status: Home / Self Care | Payer: BC Managed Care – PPO | Attending: Urology | Admitting: Urology

## 2023-02-12 ENCOUNTER — Encounter: Payer: Self-pay | Admitting: Urology

## 2023-02-12 VITALS — BP 151/85 | HR 98 | Ht 68.0 in | Wt 235.0 lb

## 2023-02-12 DIAGNOSIS — R972 Elevated prostate specific antigen [PSA]: Secondary | ICD-10-CM | POA: Insufficient documentation

## 2023-02-12 DIAGNOSIS — Z125 Encounter for screening for malignant neoplasm of prostate: Secondary | ICD-10-CM | POA: Diagnosis not present

## 2023-02-12 LAB — URINALYSIS, COMPLETE (UACMP) WITH MICROSCOPIC
Bacteria, UA: NONE SEEN
Bilirubin Urine: NEGATIVE
Glucose, UA: NEGATIVE mg/dL
Hgb urine dipstick: NEGATIVE
Ketones, ur: NEGATIVE mg/dL
Leukocytes,Ua: NEGATIVE
Nitrite: NEGATIVE
Protein, ur: 30 mg/dL — AB
RBC / HPF: NONE SEEN RBC/hpf (ref 0–5)
Specific Gravity, Urine: 1.015 (ref 1.005–1.030)
pH: 7 (ref 5.0–8.0)

## 2023-02-12 LAB — PSA: Prostatic Specific Antigen: 6.31 ng/mL — ABNORMAL HIGH (ref 0.00–4.00)

## 2023-02-12 NOTE — Progress Notes (Signed)
Haze Rushing Plume,acting as a scribe for Hollice Espy, MD.,have documented all relevant documentation on the behalf of Hollice Espy, MD,as directed by  Hollice Espy, MD while in the presence of Hollice Espy, MD.  02/12/2023 4:47 PM   Victor Carter 1966-05-08 [id number]  Referring provider: Michela Pitcher, NP [facility],  [place] [address]  Chief Complaint  Patient presents with   Elevated PSA    HPI: 57 year-old male who presents today for further evaluation of elevated PSA.  His PSA on 02/03/2023 was 6.69. He has not had a recent urinalysis.   Today, he reports no family history of prostate cancer. His uncle had colon cancer. His grandmother had a lumpectomy but he is unclear whether or not it was malignant. He denies any urinary symptoms.   PSA trend 3.41    01/12/2018 4.66    12/21/2019 6.69    02/03/2023  Results for orders placed or performed during the hospital encounter of 02/12/23  Urinalysis, Complete w Microscopic -  Result Value Ref Range   Color, Urine YELLOW YELLOW   APPearance CLEAR CLEAR   Specific Gravity, Urine 1.015 1.005 - 1.030   pH 7.0 5.0 - 8.0   Glucose, UA NEGATIVE NEGATIVE mg/dL   Hgb urine dipstick NEGATIVE NEGATIVE   Bilirubin Urine NEGATIVE NEGATIVE   Ketones, ur NEGATIVE NEGATIVE mg/dL   Protein, ur 30 (A) NEGATIVE mg/dL   Nitrite NEGATIVE NEGATIVE   Leukocytes,Ua NEGATIVE NEGATIVE   Squamous Epithelial / HPF 0-5 0 - 5 /HPF   WBC, UA 0-5 0 - 5 WBC/hpf   RBC / HPF NONE SEEN 0 - 5 RBC/hpf   Bacteria, UA NONE SEEN NONE SEEN    PMH: Past Medical History:  Diagnosis Date   Chicken pox    GERD (gastroesophageal reflux disease)    Sickle cell anemia (HCC)    trait    Surgical History: Past Surgical History:  Procedure Laterality Date   COLONOSCOPY WITH PROPOFOL N/A 01/15/2017   Procedure: COLONOSCOPY WITH PROPOFOL;  Surgeon: Jonathon Bellows, MD;  Location: [facility] ENDOSCOPY;  Service: Endoscopy;  Laterality: N/A;   LEG  TENDON SURGERY Left 1986   WISDOM TOOTH EXTRACTION      Home Medications:  Allergies as of 02/12/2023   No Known Allergies      Medication List        Accurate as of February 12, 2023  4:47 PM. If you have any questions, ask your nurse or doctor.          STOP taking these medications    albuterol 108 (90 Base) MCG/ACT inhaler Commonly known as: VENTOLIN HFA Stopped by: Hollice Espy, MD   moxifloxacin 0.5 % ophthalmic solution Commonly known as: VIGAMOX Stopped by: Hollice Espy, MD       TAKE these medications    cetirizine 10 MG tablet Commonly known as: ZYRTEC Take 1 tablet (10 mg total) by mouth daily.   sildenafil 100 MG tablet Commonly known as: VIAGRA Take 1 tablet (100 mg total) by mouth daily as needed for erectile dysfunction.        Family History: Family History  Problem Relation Age of Onset   Breast cancer Mother    Hypertension Mother    Colon cancer Maternal Uncle        unsure butt thinks 91   Breast cancer Maternal Grandmother    Arthritis Paternal Grandmother    Hypertension Paternal Grandmother    Diabetes Neg Hx  Stroke Neg Hx     Social History:  reports that he has never smoked. He has never used smokeless tobacco. He reports current alcohol use. He reports that he does not use drugs.   Physical Exam: BP (!) 151/85 (BP Location: Left Arm, Patient Position: Sitting, Cuff Size: Large)   Pulse 98   Ht '5\' 8"'$  (1.727 m)   Wt 235 lb (106.6 kg)   BMI 35.73 kg/m   Constitutional:  Alert and oriented, No acute distress. HEENT: [place] AT, moist mucus membranes.  Trachea midline, no masses. GU: Only able to palpate lower prostate. No nodularity.  Neurologic: Grossly intact, no focal deficits, moving all 4 extremities. Psychiatric: Normal mood and affect.   Assessment & Plan:    1. Elevated PSA - DRE today.  - Will consider prostate MRI pending results of PSA and DRE.  -  We reviewed the implications of an elevated PSA and the  uncertainty surrounding it. In general, a man's PSA increases with age and is produced by both normal and cancerous prostate tissue. Differential for elevated PSA is BPH, prostate cancer, infection, recent intercourse/ejaculation, prostate infarction, recent urethroscopic manipulation (foley placement/cystoscopy) and prostatitis. Management of an elevated PSA can include observation or prostate biopsy and we discussed this in detail. We discussed that indications for prostate biopsy are defined by age and race specific PSA cutoffs as well as a PSA velocity of 0.75/year. - We discussed prostate biopsy in detail including the procedure itself, the risks of blood in the urine, stool, and ejaculate, serious infection, and discomfort. He is willing to proceed with this as discussed.   Return for go to lab will call with results .   [place] [address] [place], [place] [address] [phone]

## 2023-02-15 ENCOUNTER — Telehealth: Payer: Self-pay

## 2023-02-15 NOTE — Telephone Encounter (Signed)
-----   Message from Hollice Espy, MD sent at 02/15/2023 11:03 AM EDT ----- PSA remains elevated.  Please schedule prostate biopsy and review instructions.  Hollice Espy, MD

## 2023-02-15 NOTE — Telephone Encounter (Signed)
Patient advised. Reviewed instructions and sent mychart message with that information.

## 2023-02-17 ENCOUNTER — Telehealth: Payer: Self-pay | Admitting: Gastroenterology

## 2023-02-17 NOTE — Telephone Encounter (Signed)
Patient has requested to reschedule his 03/19/23 colonoscopy with Dr. Vicente Males due to planned prostate biopsy.  Colonoscopy has been rescheduled to 03/15/23.  Endoscopy dept notified.  Referral updated with lisa.  Thanks, Sharyn Lull. CMA

## 2023-02-17 NOTE — Telephone Encounter (Signed)
Patient calling stating that he is having another procedure and will have to reschedule his colonoscopy. Requesting call back.

## 2023-03-12 ENCOUNTER — Encounter: Payer: Self-pay | Admitting: Gastroenterology

## 2023-03-15 ENCOUNTER — Ambulatory Visit
Admission: RE | Admit: 2023-03-15 | Discharge: 2023-03-15 | Disposition: A | Discharge status: Home / Self Care | Payer: BC Managed Care – PPO | Attending: Gastroenterology | Admitting: Gastroenterology

## 2023-03-15 ENCOUNTER — Ambulatory Visit: Payer: BC Managed Care – PPO | Admitting: Certified Registered"

## 2023-03-15 ENCOUNTER — Encounter: Payer: Self-pay | Admitting: Gastroenterology

## 2023-03-15 ENCOUNTER — Encounter
Admission: RE | Disposition: A | Discharge status: Home / Self Care | Payer: Self-pay | Source: Home / Self Care | Attending: Gastroenterology

## 2023-03-15 DIAGNOSIS — D128 Benign neoplasm of rectum: Secondary | ICD-10-CM | POA: Insufficient documentation

## 2023-03-15 DIAGNOSIS — K219 Gastro-esophageal reflux disease without esophagitis: Secondary | ICD-10-CM | POA: Diagnosis not present

## 2023-03-15 DIAGNOSIS — Z860100 Personal history of colon polyps, unspecified: Secondary | ICD-10-CM

## 2023-03-15 DIAGNOSIS — D122 Benign neoplasm of ascending colon: Secondary | ICD-10-CM | POA: Insufficient documentation

## 2023-03-15 DIAGNOSIS — D126 Benign neoplasm of colon, unspecified: Secondary | ICD-10-CM

## 2023-03-15 DIAGNOSIS — K635 Polyp of colon: Secondary | ICD-10-CM | POA: Diagnosis not present

## 2023-03-15 DIAGNOSIS — K649 Unspecified hemorrhoids: Secondary | ICD-10-CM | POA: Diagnosis not present

## 2023-03-15 DIAGNOSIS — Z1211 Encounter for screening for malignant neoplasm of colon: Secondary | ICD-10-CM | POA: Diagnosis not present

## 2023-03-15 HISTORY — PX: COLONOSCOPY WITH PROPOFOL: SHX5780

## 2023-03-15 SURGERY — COLONOSCOPY WITH PROPOFOL
Anesthesia: General

## 2023-03-15 MED ORDER — LIDOCAINE HCL (CARDIAC) PF 100 MG/5ML IV SOSY
PREFILLED_SYRINGE | INTRAVENOUS | Status: DC | PRN
  Administered 2023-03-15: 50 mg via INTRAVENOUS

## 2023-03-15 MED ORDER — SODIUM CHLORIDE 0.9 % IV SOLN: INTRAVENOUS | Status: DC

## 2023-03-15 MED ORDER — PROPOFOL 500 MG/50ML IV EMUL
INTRAVENOUS | Status: DC | PRN
  Administered 2023-03-15: 150 ug/kg/min via INTRAVENOUS

## 2023-03-15 MED ORDER — PROPOFOL 10 MG/ML IV BOLUS
INTRAVENOUS | Status: DC | PRN
  Administered 2023-03-15: 80 mg via INTRAVENOUS

## 2023-03-15 NOTE — Anesthesia Postprocedure Evaluation (Signed)
Anesthesia Post Note  Patient: Victor Carter  Procedure(s) Performed: COLONOSCOPY WITH PROPOFOL  Patient location during evaluation: Endoscopy Anesthesia Type: General Level of consciousness: awake and alert Pain management: pain level controlled Vital Signs Assessment: post-procedure vital signs reviewed and stable Respiratory status: spontaneous breathing, nonlabored ventilation, respiratory function stable and patient connected to nasal cannula oxygen Cardiovascular status: blood pressure returned to baseline and stable Postop Assessment: no apparent nausea or vomiting Anesthetic complications: no   No notable events documented.   Last Vitals:  Vitals:   03/15/23 0925 03/15/23 0935  BP: 111/62 119/84  Pulse: 91 86  Resp: 19 18  Temp: (!) 36 C   SpO2: 94% 99%    Last Pain:  Vitals:   03/15/23 0935  TempSrc:   PainSc: 0-No pain                 Louie Boston

## 2023-03-15 NOTE — Transfer of Care (Signed)
Immediate Anesthesia Transfer of Care Note  Patient: Victor Carter  Procedure(s) Performed: COLONOSCOPY WITH PROPOFOL  Patient Location: PACU and Endoscopy Unit  Anesthesia Type:General  Level of Consciousness: awake  Airway & Oxygen Therapy: Patient Spontanous Breathing  Post-op Assessment: Report given to RN and Post -op Vital signs reviewed and stable  Post vital signs: Reviewed and stable  Last Vitals:  Vitals Value Taken Time  BP 111/62 03/15/23 0925  Temp 36 C 03/15/23 0925  Pulse 89 03/15/23 0926  Resp 20 03/15/23 0926  SpO2 97 % 03/15/23 0926  Vitals shown include unvalidated device data.  Last Pain:  Vitals:   03/15/23 0925  TempSrc: Temporal  PainSc: 0-No pain         Complications: No notable events documented.

## 2023-03-15 NOTE — Anesthesia Preprocedure Evaluation (Signed)
Anesthesia Evaluation  Patient identified by MRN, date of birth, ID band Patient awake    Reviewed: Allergy & Precautions, NPO status , Patient's Chart, lab work & pertinent test results  History of Anesthesia Complications Negative for: history of anesthetic complications  Airway Mallampati: III  TM Distance: >3 FB Neck ROM: full    Dental no notable dental hx.    Pulmonary neg pulmonary ROS   Pulmonary exam normal        Cardiovascular negative cardio ROS Normal cardiovascular exam     Neuro/Psych negative neurological ROS  negative psych ROS   GI/Hepatic Neg liver ROS,GERD  Medicated,,  Endo/Other  negative endocrine ROS    Renal/GU negative Renal ROS  negative genitourinary   Musculoskeletal   Abdominal   Peds  Hematology negative hematology ROS (+)   Anesthesia Other Findings Past Medical History: No date: Chicken pox No date: GERD (gastroesophageal reflux disease) No date: Sickle cell anemia     Comment:  trait  Past Surgical History: 02/08/2023: bone graft dental surgery right side 01/15/2017: COLONOSCOPY WITH PROPOFOL; N/A     Comment:  Procedure: COLONOSCOPY WITH PROPOFOL;  Surgeon: Wyline Mood, MD;  Location: [facility] ENDOSCOPY;  Service: Endoscopy;              Laterality: N/A; 1986: LEG TENDON SURGERY; Left No date: WISDOM TOOTH EXTRACTION  BMI    Body Mass Index: 36.04 kg/m      Reproductive/Obstetrics negative OB ROS                             Anesthesia Physical Anesthesia Plan  ASA: 2  Anesthesia Plan: General   Post-op Pain Management: Minimal or no pain anticipated   Induction: Intravenous  PONV Risk Score and Plan: 1 and Propofol infusion and TIVA  Airway Management Planned: Natural Airway and Nasal Cannula  Additional Equipment:   Intra-op Plan:   Post-operative Plan:   Informed Consent: I have reviewed the patients History and  Physical, chart, labs and discussed the procedure including the risks, benefits and alternatives for the proposed anesthesia with the patient or authorized representative who has indicated his/her understanding and acceptance.     Dental Advisory Given  Plan Discussed with: Anesthesiologist, CRNA and Surgeon  Anesthesia Plan Comments: (Patient consented for risks of anesthesia including but not limited to:  - adverse reactions to medications - risk of airway placement if required - damage to eyes, teeth, lips or other oral mucosa - nerve damage due to positioning  - sore throat or hoarseness - Damage to heart, brain, nerves, lungs, other parts of body or loss of life  Patient voiced understanding.)       Anesthesia Quick Evaluation

## 2023-03-15 NOTE — H&P (Signed)
Wyline Mood, MD [address], [place], [place], [address] [address], [address], [place], [place], [address] Phone: [phone]  Fax: [phone]  Primary Care Physician:  Eden Emms, NP   Pre-Procedure History & Physical: HPI:  Victor Carter is a 57 y.o. male is here for an colonoscopy.   Past Medical History:  Diagnosis Date   Chicken pox    GERD (gastroesophageal reflux disease)    Sickle cell anemia    trait    Past Surgical History:  Procedure Laterality Date   COLONOSCOPY WITH PROPOFOL N/A 01/15/2017   Procedure: COLONOSCOPY WITH PROPOFOL;  Surgeon: Wyline Mood, MD;  Location: [facility] ENDOSCOPY;  Service: Endoscopy;  Laterality: N/A;   LEG TENDON SURGERY Left 1986   WISDOM TOOTH EXTRACTION      Prior to Admission medications   Medication Sig Start Date End Date Taking? Authorizing Provider  cetirizine (ZYRTEC) 10 MG tablet Take 1 tablet (10 mg total) by mouth daily. 02/03/23   Eden Emms, NP  sildenafil (VIAGRA) 100 MG tablet Take 1 tablet (100 mg total) by mouth daily as needed for erectile dysfunction. 02/03/23   Eden Emms, NP    Allergies as of 02/03/2023   (No Known Allergies)    Family History  Problem Relation Age of Onset   Breast cancer Mother    Hypertension Mother    Colon cancer Maternal Uncle        unsure butt thinks 50   Breast cancer Maternal Grandmother    Arthritis Paternal Grandmother    Hypertension Paternal Grandmother    Diabetes Neg Hx    Stroke Neg Hx     Social History   Socioeconomic History   Marital status: Married    Spouse name: Mudlogger   Number of children: 0   Years of education: Not on file   Highest education level: Not on file  Occupational History   Not on file  Tobacco Use   Smoking status: Never   Smokeless tobacco: Never  Vaping Use   Vaping Use: Never used  Substance and Sexual Activity   Alcohol use: Yes    Alcohol/week: 0.0 standard drinks of alcohol    Comment: occasional-social    Drug use: No   Sexual activity: Yes  Other Topics Concern   Not on file  Social History Narrative   Fulltime: Supervisor [employer]      Hobbies:going to sports events, Education officer, community. Restoring car and riding motorcycle   Social Determinants of Health   Financial Resource Strain: Not on file  Food Insecurity: Not on file  Transportation Needs: Not on file  Physical Activity: Not on file  Stress: Not on file  Social Connections: Not on file  Intimate Partner Violence: Not on file    Review of Systems: See HPI, otherwise negative ROS  Physical Exam: There were no vitals taken for this visit. General:   Alert,  pleasant and cooperative in NAD Head:  Normocephalic and atraumatic. Neck:  Supple; no masses or thyromegaly. Lungs:  Clear throughout to auscultation, normal respiratory effort.    Heart:  +S1, +S2, Regular rate and rhythm, No edema. Abdomen:  Soft, nontender and nondistended. Normal bowel sounds, without guarding, and without rebound.   Neurologic:  Alert and  oriented x4;  grossly normal neurologically.  Impression/Plan: Victor Carter is here for an colonoscopy to be performed for surveillance due to prior history of colon polyps   Risks, benefits, limitations, and alternatives regarding  colonoscopy have  been reviewed with the patient.  Questions have been answered.  All parties agreeable.   Wyline Mood, MD  03/15/2023, 8:24 AM

## 2023-03-15 NOTE — Anesthesia Procedure Notes (Signed)
Procedure Name: MAC Date/Time: 03/15/2023 9:07 AM  Performed by: Cheral Bay, CRNAPre-anesthesia Checklist: Patient identified, Emergency Drugs available, Suction available, Patient being monitored and Timeout performed Patient Re-evaluated:Patient Re-evaluated prior to induction Oxygen Delivery Method: Nasal cannula Induction Type: IV induction Placement Confirmation: positive ETCO2 and CO2 detector

## 2023-03-15 NOTE — Op Note (Signed)
[facility] Gastroenterology Patient Name: Victor Carter Procedure Date: 03/15/2023 8:54 AM MRN: [id number] Account #: [id number] Date of Birth: 1966-03-12 Admit Type: Outpatient Age: 57 [facility] Gender: Male Note Status: Finalized Instrument Name: Nelda MarseilleColonscope [id number] Procedure:             Colonoscopy Indications:           Screening for colorectal malignant neoplasm Providers:             Wyline MoodKiran Joslyn Ramos MD, MD Medicines:             Monitored Anesthesia Care Complications:         No immediate complications. Procedure:             Pre-Anesthesia Assessment:                        - Prior to the procedure, a History and Physical was                         performed, and patient medications, allergies and                         sensitivities were reviewed. The patient's tolerance                         of previous anesthesia was reviewed.                        - The risks and benefits of the procedure and the                         sedation options and risks were discussed with the                         patient. All questions were answered and informed                         consent was obtained.                        - ASA Grade Assessment: II - A patient with mild                         systemic disease.                        After obtaining informed consent, the colonoscope was                         passed under direct vision. Throughout the procedure,                         the patient's blood pressure, pulse, and oxygen                         saturations were monitored continuously. The                         Colonoscope was introduced through the anus and  advanced to the the cecum, identified by the                         appendiceal orifice. The colonoscopy was performed                         with ease. The patient tolerated the procedure well.                         The quality of the bowel preparation  was good. The                         ileocecal valve, appendiceal orifice, and rectum were                         photographed. Findings:      The perianal and digital rectal examinations were normal.      Two sessile polyps were found in the ascending colon. The polyps were 5       to 6 mm in size. These polyps were removed with a cold snare. Resection       and retrieval were complete.      A 5 mm polyp was found in the rectum. The polyp was sessile. The polyp       was removed with a cold snare. Resection and retrieval were complete.      The exam was otherwise without abnormality on direct and retroflexion       views. Impression:            - Two 5 to 6 mm polyps in the ascending colon, removed                         with a cold snare. Resected and retrieved.                        - One 5 mm polyp in the rectum, removed with a cold                         snare. Resected and retrieved.                        - The examination was otherwise normal on direct and                         retroflexion views. Recommendation:        - Discharge patient to home (with escort).                        - Resume previous diet.                        - Continue present medications.                        - Await pathology results.                        - Repeat colonoscopy for surveillance based on  pathology results. Procedure Code(s):     --- Professional ---                        [phone], Colonoscopy, flexible; with removal of                         tumor(s), polyp(s), or other lesion(s) by snare                         technique Diagnosis Code(s):     --- Professional ---                        Z12.11, Encounter for screening for malignant neoplasm                         of colon                        D12.8, Benign neoplasm of rectum                        D12.2, Benign neoplasm of ascending colon CPT copyright 2022 American Medical Association. All rights  reserved. The codes documented in this report are preliminary and upon coder review may  be revised to meet current compliance requirements. Wyline Mood, MD Wyline Mood MD, MD 03/15/2023 9:22:05 AM This report has been signed electronically. Number of Addenda: 0 Note Initiated On: 03/15/2023 8:54 AM Scope Withdrawal Time: 0 hours 12 minutes 39 seconds  Total Procedure Duration: 0 hours 13 minutes 56 seconds  Estimated Blood Loss:  Estimated blood loss: none.      [facility]

## 2023-03-16 ENCOUNTER — Encounter: Payer: Self-pay | Admitting: Gastroenterology

## 2023-03-16 LAB — SURGICAL PATHOLOGY

## 2023-03-17 ENCOUNTER — Other Ambulatory Visit: Payer: BLUE CROSS/BLUE SHIELD | Admitting: Urology

## 2023-03-24 ENCOUNTER — Ambulatory Visit: Payer: BLUE CROSS/BLUE SHIELD | Admitting: Urology

## 2023-03-31 ENCOUNTER — Ambulatory Visit (INDEPENDENT_AMBULATORY_CARE_PROVIDER_SITE_OTHER): Payer: BC Managed Care – PPO | Admitting: Urology

## 2023-03-31 VITALS — BP 133/89 | HR 89 | Ht 68.0 in | Wt 244.5 lb

## 2023-03-31 DIAGNOSIS — R972 Elevated prostate specific antigen [PSA]: Secondary | ICD-10-CM

## 2023-03-31 DIAGNOSIS — C61 Malignant neoplasm of prostate: Secondary | ICD-10-CM | POA: Diagnosis not present

## 2023-03-31 DIAGNOSIS — Z2989 Encounter for other specified prophylactic measures: Secondary | ICD-10-CM

## 2023-03-31 MED ORDER — GENTAMICIN SULFATE 40 MG/ML IJ SOLN
80.0000 mg | Freq: Once | INTRAMUSCULAR | Status: AC
  Administered 2023-03-31: 80 mg via INTRAMUSCULAR

## 2023-03-31 MED ORDER — LEVOFLOXACIN 500 MG PO TABS
500.0000 mg | ORAL_TABLET | Freq: Once | ORAL | Status: AC
Start: 2023-03-31 — End: 2023-03-31
  Administered 2023-03-31: 500 mg via ORAL

## 2023-03-31 NOTE — Addendum Note (Signed)
Addended by: Consuella Lose on: 03/31/2023 09:31 AM   Modules accepted: Orders

## 2023-03-31 NOTE — Progress Notes (Signed)
   03/31/23  CC:  Chief Complaint  Patient presents with   Prostate Biopsy    HPI: 57 year old male with elevated/rising PSA presents today for biopsy.  NED. A&Ox3.   No respiratory distress   Abd soft, NT, ND Normal sphincter tone  Prostate Biopsy Procedure   Informed consent was obtained after discussing risks/benefits of the procedure.  A time out was performed to ensure correct patient identity.  Pre-Procedure: - Last PSA Level:  Lab Results  Component Value Date   PSA 6.69 (H) 02/03/2023   PSA 4.66 (H) 12/21/2019   PSA 3.41 01/12/2018   - Gentamicin given prophylactically - Levaquin 500 mg administered PO -Transrectal Ultrasound performed revealing a 37.9 gm prostate -No significant hypoechoic or median lobe noted  Procedure: - Prostate block performed using 10 cc 1% lidocaine and biopsies taken from sextant areas, a total of 12 under ultrasound guidance.  Post-Procedure: - Patient tolerated the procedure well - He was counseled to seek immediate medical attention if experiences any severe pain, significant bleeding, or fevers - Return in one week to discuss biopsy results   Vanna Scotland, MD

## 2023-04-01 LAB — SURGICAL PATHOLOGY

## 2023-04-06 ENCOUNTER — Ambulatory Visit (INDEPENDENT_AMBULATORY_CARE_PROVIDER_SITE_OTHER): Payer: BC Managed Care – PPO | Admitting: Urology

## 2023-04-06 VITALS — BP 145/103 | HR 81 | Ht 68.0 in | Wt 244.0 lb

## 2023-04-06 DIAGNOSIS — C61 Malignant neoplasm of prostate: Secondary | ICD-10-CM | POA: Diagnosis not present

## 2023-04-06 NOTE — Progress Notes (Signed)
I, Amy L Pierron,acting as a scribe for Vanna Scotland, MD.,have documented all relevant documentation on the behalf of Vanna Scotland, MD,as directed by  Vanna Scotland, MD while in the presence of Vanna Scotland, MD.  04/06/2023 8:56 AM   Victor Carter 1966-11-10 [id number]  Referring provider: Eden Emms, NP [address] [place],  [place] [address]  Chief Complaint  Patient presents with   Follow-up    Pathology results     HPI: 57 year-old male who presents today for follow-up prostate biopsy results.   He initially presented to urology clinic with elevated rising PSA up to 6.69, a steady upward trend over the previous years. He has no family history of prostate cancer. His TRUS volume was 37.9 grams. Biopsy was well tolerated and revealed Gleason 3+3 very small volume in four cores bilaterally, up to 17 percent.   He is doing well overall with no new symptoms or complaints.     PMH: Past Medical History:  Diagnosis Date   Chicken pox    GERD (gastroesophageal reflux disease)    Sickle cell anemia (HCC)    trait    Surgical History: Past Surgical History:  Procedure Laterality Date   bone graft dental surgery right side  02/08/2023   COLONOSCOPY WITH PROPOFOL N/A 01/15/2017   Procedure: COLONOSCOPY WITH PROPOFOL;  Surgeon: Wyline Mood, MD;  Location: [facility] ENDOSCOPY;  Service: Endoscopy;  Laterality: N/A;   COLONOSCOPY WITH PROPOFOL N/A 03/15/2023   Procedure: COLONOSCOPY WITH PROPOFOL;  Surgeon: Wyline Mood, MD;  [facility];  Service: Gastroenterology;  Laterality: N/A;   LEG TENDON SURGERY Left 1986   WISDOM TOOTH EXTRACTION      Home Medications:  Allergies as of 04/06/2023   No Known Allergies      Medication List        Accurate as of April 06, 2023  8:56 AM. If you have any questions, ask your nurse or doctor.          cetirizine 10 MG tablet Commonly known as: ZYRTEC Take 1 tablet (10 mg total) by mouth daily.    cholecalciferol 25 MCG (1000 UNIT) tablet Commonly known as: VITAMIN D3 Take 1,000 Units by mouth daily.   Fish Oil 1000 MG Caps Take by mouth.   multivitamin capsule Take 1 capsule by mouth daily.   sildenafil 100 MG tablet Commonly known as: VIAGRA Take 1 tablet (100 mg total) by mouth daily as needed for erectile dysfunction.   TURMERIC PO Take by mouth.        Family History: Family History  Problem Relation Age of Onset   Breast cancer Mother    Hypertension Mother    Colon cancer Maternal Uncle        unsure butt thinks 50   Breast cancer Maternal Grandmother    Arthritis Paternal Grandmother    Hypertension Paternal Grandmother    Diabetes Neg Hx    Stroke Neg Hx     Social History:  reports that he has never smoked. He has never used smokeless tobacco. He reports current alcohol use. He reports that he does not use drugs.   Physical Exam: BP (!) 145/103   Pulse 81   Ht 5\' 8"  (1.727 m)   Wt 244 lb (110.7 kg)   BMI 37.10 kg/m   Constitutional:  Alert and oriented, No acute distress. HEENT: [place] AT, moist mucus membranes.  Trachea midline, no masses. Neurologic: Grossly intact, no focal deficits, moving all 4  extremities. Psychiatric: Normal mood and affect.   Assessment & Plan:    Low risk prostate cancer  - The patient was counseled about the natural history of prostate cancer and the standard treatment options that are available for prostate cancer. It was explained to him how his age and life expectancy, clinical stage, Gleason score, and PSA affect his prognosis, the decision to proceed with additional staging studies, as well as how that information influences recommended treatment strategies. We discussed the roles for active surveillance, radiation therapy, surgical therapy, androgen deprivation, as well as ablative therapy options for the treatment of prostate cancer as appropriate to his individual cancer situation. We discussed the risks and  benefits of these options with regard to their impact on cancer control and also in terms of potential adverse events, complications, and impact on quality of life particularly related to urinary, bowel, and sexual function. The patient was encouraged to ask questions throughout the discussion today and all questions were answered to his stated satisfaction. In addition, the patient was provided with and/or directed to appropriate resources and literature for further education about prostate cancer treatment options.  - Most strongly recommend active surveillance. Plan for a PSA in 6 months and consideration of a prostate MRI +/- fusion biopsy depending on the results.  - PSA trend in one year.  Return in about 6 months (around 10/06/2023) for PSA.  I have reviewed the above documentation for accuracy and completeness, and I agree with the above.   Vanna Scotland, MD   [facility] [address] [place], [place] [address] [phone]

## 2023-04-14 ENCOUNTER — Encounter: Payer: Self-pay | Admitting: Gastroenterology

## 2023-05-04 ENCOUNTER — Ambulatory Visit: Payer: BC Managed Care – PPO

## 2023-05-19 ENCOUNTER — Ambulatory Visit (INDEPENDENT_AMBULATORY_CARE_PROVIDER_SITE_OTHER): Payer: BC Managed Care – PPO

## 2023-05-19 DIAGNOSIS — Z23 Encounter for immunization: Secondary | ICD-10-CM

## 2023-05-19 NOTE — Progress Notes (Signed)
Per orders of Audria Nine, NP, Shingrix Vaccine #2 given by Eual Fines, CMA in Left Deltoid. Patient tolerated injection well.

## 2023-09-30 ENCOUNTER — Other Ambulatory Visit: Payer: Self-pay | Admitting: *Deleted

## 2023-09-30 DIAGNOSIS — R972 Elevated prostate specific antigen [PSA]: Secondary | ICD-10-CM

## 2023-10-01 ENCOUNTER — Other Ambulatory Visit: Payer: BC Managed Care – PPO

## 2023-10-01 DIAGNOSIS — R972 Elevated prostate specific antigen [PSA]: Secondary | ICD-10-CM

## 2023-10-02 LAB — PSA: Prostate Specific Ag, Serum: 7.5 ng/mL — ABNORMAL HIGH (ref 0.0–4.0)

## 2023-10-06 ENCOUNTER — Ambulatory Visit: Payer: BC Managed Care – PPO | Admitting: Urology

## 2023-10-06 VITALS — BP 131/89 | HR 87 | Ht 68.0 in | Wt 249.4 lb

## 2023-10-06 DIAGNOSIS — C61 Malignant neoplasm of prostate: Secondary | ICD-10-CM

## 2023-10-06 NOTE — Progress Notes (Signed)
Victor Carter,acting as a scribe for Vanna Scotland, MD.,have documented all relevant documentation on the behalf of Vanna Scotland, MD,as directed by  Vanna Scotland, MD while in the presence of Vanna Scotland, MD.  10/06/2023 9:31 AM   Victor Carter 30-Nov-1966 [id number]  Referring provider: Eden Emms, NP [address] [place],  [place] [address]  Chief Complaint  Patient presents with   Prostate Cancer    HPI: 57 year-old male with low risk prostate cancer who presents today for a 6 month follow up.   He was diagnosed with Gleason 3+3 prostate cancer in 03/2023. He had low volume disease. PSA at the time of diagnosis was 6.69. TRUS volume was 37.9 grams.   His most recent PSA on 10/01/2023 was 7.5.    PMH: Past Medical History:  Diagnosis Date   Chicken pox    GERD (gastroesophageal reflux disease)    Sickle cell anemia (HCC)    trait    Surgical History: Past Surgical History:  Procedure Laterality Date   bone graft dental surgery right side  02/08/2023   COLONOSCOPY WITH PROPOFOL N/A 01/15/2017   Procedure: COLONOSCOPY WITH PROPOFOL;  Surgeon: Wyline Mood, MD;  Location: [facility] ENDOSCOPY;  Service: Endoscopy;  Laterality: N/A;   COLONOSCOPY WITH PROPOFOL N/A 03/15/2023   Procedure: COLONOSCOPY WITH PROPOFOL;  Surgeon: Wyline Mood, MD;  Location: [facility] ENDOSCOPY;  Service: Gastroenterology;  Laterality: N/A;   LEG TENDON SURGERY Left 1986   WISDOM TOOTH EXTRACTION      Home Medications:  Allergies as of 10/06/2023   No Known Allergies      Medication List        Accurate as of October 06, 2023  9:31 AM. If you have any questions, ask your nurse or doctor.          STOP taking these medications    sildenafil 100 MG tablet Commonly known as: VIAGRA       TAKE these medications    cetirizine 10 MG tablet Commonly known as: ZYRTEC Take 1 tablet (10 mg total) by mouth daily.   cholecalciferol 25 MCG (1000 UNIT) tablet Commonly known  as: VITAMIN D3 Take 1,000 Units by mouth daily.   Fish Oil 1000 MG Caps Take by mouth.   multivitamin capsule Take 1 capsule by mouth daily.   TURMERIC PO Take by mouth.        Family History: Family History  Problem Relation Age of Onset   Breast cancer Mother    Hypertension Mother    Colon cancer Maternal Uncle        unsure butt thinks 50   Breast cancer Maternal Grandmother    Arthritis Paternal Grandmother    Hypertension Paternal Grandmother    Diabetes Neg Hx    Stroke Neg Hx     Social History:  reports that he has never smoked. He has never used smokeless tobacco. He reports current alcohol use. He reports that he does not use drugs.   Physical Exam: BP 131/89   Pulse 87   Ht 5\' 8"  (1.727 m)   Wt 249 lb 6 oz (113.1 kg)   BMI 37.92 kg/m   Constitutional:  Alert and oriented, No acute distress. HEENT: [place] AT, moist mucus membranes.  Trachea midline, no masses. Neurologic: Grossly intact, no focal deficits, moving all 4 extremities. Psychiatric: Normal mood and affect.   Assessment & Plan:    1. Prostate cancer - Recommend a prostate MRI both to serve  as baseline and to evaluate rising PSA.  He is agreeable this plan. - Repeat PSA in 6 months  Return in about 6 months (around 04/05/2024) for repeat PSA.  Will call/MyChart message  with MRI report.  I have reviewed the above documentation for accuracy and completeness, and I agree with the above.   Vanna Scotland, MD    [facility] [address] [place], [place] [address] [phone]

## 2023-10-07 LAB — PSA: Prostate Specific Ag, Serum: 7.9 ng/mL — ABNORMAL HIGH (ref 0.0–4.0)

## 2024-02-07 ENCOUNTER — Ambulatory Visit (INDEPENDENT_AMBULATORY_CARE_PROVIDER_SITE_OTHER): Payer: BC Managed Care – PPO | Admitting: Nurse Practitioner

## 2024-02-07 ENCOUNTER — Encounter: Payer: Self-pay | Admitting: Nurse Practitioner

## 2024-02-07 VITALS — BP 124/80 | HR 88 | Temp 98.4°F | Ht 67.0 in | Wt 247.2 lb

## 2024-02-07 DIAGNOSIS — Z131 Encounter for screening for diabetes mellitus: Secondary | ICD-10-CM

## 2024-02-07 DIAGNOSIS — E669 Obesity, unspecified: Secondary | ICD-10-CM

## 2024-02-07 DIAGNOSIS — Z13220 Encounter for screening for lipoid disorders: Secondary | ICD-10-CM | POA: Diagnosis not present

## 2024-02-07 DIAGNOSIS — J302 Other seasonal allergic rhinitis: Secondary | ICD-10-CM

## 2024-02-07 DIAGNOSIS — R972 Elevated prostate specific antigen [PSA]: Secondary | ICD-10-CM | POA: Diagnosis not present

## 2024-02-07 DIAGNOSIS — Z1159 Encounter for screening for other viral diseases: Secondary | ICD-10-CM | POA: Diagnosis not present

## 2024-02-07 DIAGNOSIS — G4700 Insomnia, unspecified: Secondary | ICD-10-CM

## 2024-02-07 DIAGNOSIS — Z0000 Encounter for general adult medical examination without abnormal findings: Secondary | ICD-10-CM

## 2024-02-07 DIAGNOSIS — K625 Hemorrhage of anus and rectum: Secondary | ICD-10-CM | POA: Insufficient documentation

## 2024-02-07 DIAGNOSIS — Z125 Encounter for screening for malignant neoplasm of prostate: Secondary | ICD-10-CM

## 2024-02-07 DIAGNOSIS — R0982 Postnasal drip: Secondary | ICD-10-CM | POA: Diagnosis not present

## 2024-02-07 LAB — COMPREHENSIVE METABOLIC PANEL
ALT: 37 U/L (ref 0–53)
AST: 25 U/L (ref 0–37)
Albumin: 4.5 g/dL (ref 3.5–5.2)
Alkaline Phosphatase: 71 U/L (ref 39–117)
BUN: 13 mg/dL (ref 6–23)
CO2: 27 meq/L (ref 19–32)
Calcium: 9.8 mg/dL (ref 8.4–10.5)
Chloride: 104 meq/L (ref 96–112)
Creatinine, Ser: 1.37 mg/dL (ref 0.40–1.50)
GFR: 57.55 mL/min — ABNORMAL LOW (ref >60.00)
Glucose, Bld: 96 mg/dL (ref 70–99)
Potassium: 4.3 meq/L (ref 3.5–5.1)
Sodium: 143 meq/L (ref 135–145)
Total Bilirubin: 0.8 mg/dL (ref 0.2–1.2)
Total Protein: 7.5 g/dL (ref 6.0–8.3)

## 2024-02-07 LAB — CBC
HCT: 45.3 % (ref 39.0–52.0)
Hemoglobin: 14.9 g/dL (ref 13.0–17.0)
MCHC: 32.8 g/dL (ref 30.0–36.0)
MCV: 99.5 fl (ref 78.0–100.0)
Platelets: 243 10*3/uL (ref 150.0–400.0)
RBC: 4.55 Mil/uL (ref 4.22–5.81)
RDW: 13.1 % (ref 11.5–15.5)
WBC: 6.3 10*3/uL (ref 4.0–10.5)

## 2024-02-07 LAB — LIPID PANEL
Cholesterol: 194 mg/dL (ref 0–200)
HDL: 52.2 mg/dL (ref >39.00)
LDL Cholesterol: 113 mg/dL — ABNORMAL HIGH (ref 0–99)
NonHDL: 142.09
Total CHOL/HDL Ratio: 4
Triglycerides: 146 mg/dL (ref 0.0–149.0)
VLDL: 29.2 mg/dL (ref 0.0–40.0)

## 2024-02-07 LAB — TSH: TSH: 2.24 u[IU]/mL (ref 0.35–5.50)

## 2024-02-07 LAB — PSA: PSA: 8.19 ng/mL — ABNORMAL HIGH (ref 0.10–4.00)

## 2024-02-07 LAB — HEMOGLOBIN A1C: Hgb A1c MFr Bld: 4.9 % (ref 4.6–6.5)

## 2024-02-07 MED ORDER — FLUTICASONE PROPIONATE 50 MCG/ACT NA SUSP
2.0000 | Freq: Every day | NASAL | 0 refills | Status: DC
Start: 2024-02-07 — End: 2024-02-21

## 2024-02-07 MED ORDER — LEVOCETIRIZINE DIHYDROCHLORIDE 5 MG PO TABS
5.0000 mg | ORAL_TABLET | Freq: Every evening | ORAL | 0 refills | Status: DC
Start: 2024-02-07 — End: 2024-02-21

## 2024-02-07 NOTE — Assessment & Plan Note (Signed)
 For outpatient levocetirizine 5 mg daily.

## 2024-02-07 NOTE — Assessment & Plan Note (Signed)
 Pending TSH, A1c, lipid panel.  Continue work on healthy lifestyle modifications.  Patient was given medical recommendation of exercise of 30 minutes a day 5 times a week.  Also given information for the healthy weight wellness clinic at [facility] in [place]

## 2024-02-07 NOTE — Assessment & Plan Note (Signed)
 History of a hemorrhoid.  Patient states intermittent recently a colonoscopy.  If this continues or concerns patient we can consider doing an iFOB with follow-up with GI thereafter if positive.

## 2024-02-07 NOTE — Assessment & Plan Note (Signed)
 Will do fluticasone nasal spray 50 mcg per actuation 2 sprays each nostril daily along with levocetirizine 5 mg daily.

## 2024-02-07 NOTE — Progress Notes (Signed)
 Established Patient Office Visit  Subjective   Patient ID: Victor Carter, male    DOB: [birth year]  Age: 58 y.o. MRN: [id number]  Chief Complaint  Patient presents with   Annual Exam    HPI  Allergies: he will use second-generation over-the-counter antihistamine as needed. 3-4 days a week  for complete physical and follow up of chronic conditions.  Immunizations: -Tetanus: Completed in 2016 -Influenza: refused  -Shingles: Completed Shingrix series -Pneumonia: Too young  Diet: Fair diet. He is eating 2 meals a day. Small breakfast that is fruit. He will drink water Exercise: No regular exercise. Walking   Eye exam: Completes annually. Prescription glasses PRN  Dental exam: Completes semi-annually    Colonoscopy: Completed in 03/15/2023, recall in 3 years  Lung Cancer Screening: N/A  PSA: Due.  Patient is followed by urology Dr. Vanna Scotland for elevated PSA.  Has a follow-up in April along with MRI.  Sleep: does not sllep well. States that he does have drianage ever since he has been known graft surgery to the right upper jaw.    Review of Systems  Constitutional:  Negative for chills and fever.  Respiratory:  Negative for shortness of breath.   Cardiovascular:  Negative for chest pain and leg swelling.  Gastrointestinal:  Positive for blood in stool (BRRP). Negative for abdominal pain, constipation, diarrhea, nausea and vomiting.       BM daily  Blood in stool is intermittent  Genitourinary:  Negative for dysuria and hematuria.  Neurological:  Negative for tingling and headaches.  Psychiatric/Behavioral:  Negative for hallucinations and suicidal ideas.       Objective:     BP 124/80   Pulse 88   Temp 98.4 F (36.9 C) (Oral)   Ht 5\' 7"  (1.702 m)   Wt 247 lb 3.2 oz (112.1 kg)   SpO2 93%   BMI 38.72 kg/m  BP Readings from Last 3 Encounters:  02/07/24 124/80  10/06/23 131/89  04/06/23 (!) 145/103   Wt Readings from Last 3 Encounters:  02/07/24  247 lb 3.2 oz (112.1 kg)  10/06/23 249 lb 6 oz (113.1 kg)  04/06/23 244 lb (110.7 kg)   SpO2 Readings from Last 3 Encounters:  02/07/24 93%  03/15/23 100%  02/03/23 95%      Physical Exam Vitals and nursing note reviewed.  Constitutional:      Appearance: Normal appearance.  HENT:     Right Ear: Tympanic membrane, ear canal and external ear normal.     Left Ear: Tympanic membrane, ear canal and external ear normal.     Mouth/Throat:     Mouth: Mucous membranes are moist.     Pharynx: Oropharynx is clear.  Eyes:     Extraocular Movements: Extraocular movements intact.     Pupils: Pupils are equal, round, and reactive to light.  Cardiovascular:     Rate and Rhythm: Normal rate and regular rhythm.     Pulses: Normal pulses.     Heart sounds: Normal heart sounds.  Pulmonary:     Effort: Pulmonary effort is normal.     Breath sounds: Normal breath sounds.  Abdominal:     General: Bowel sounds are normal. There is no distension.     Palpations: There is no mass.     Tenderness: There is no abdominal tenderness.     Hernia: No hernia is present.  Musculoskeletal:     Right lower leg: No edema.     Left lower leg: No edema.  Lymphadenopathy:     Cervical: No cervical adenopathy.  Skin:    General: Skin is warm.  Neurological:     General: No focal deficit present.     Mental Status: He is alert.     Deep Tendon Reflexes:     Reflex Scores:      Bicep reflexes are 2+ on the right side and 2+ on the left side.      Patellar reflexes are 2+ on the right side and 2+ on the left side.    Comments: Bilateral upper and lower extremity strength 5/5  Psychiatric:        Mood and Affect: Mood normal.        Behavior: Behavior normal.        Thought Content: Thought content normal.        Judgment: Judgment normal.      No results found for any visits on 02/07/24.    The 10-year ASCVD risk score (Arnett DK, et al., 2019) is: 6.3%    Assessment & Plan:   Problem List  Items Addressed This Visit       Digestive   BRBPR (bright red blood per rectum)   History of a hemorrhoid.  Patient states intermittent recently a colonoscopy.  If this continues or concerns patient we can consider doing an iFOB with follow-up with GI thereafter if positive.        Other   Insomnia   Send secondary to postnasal discharge.  Will start patient on daily second-generation histamine and fluticasone nasal spray.      Relevant Orders   TSH   Preventative health care - Primary   Discussed age-appropriate immunizations and screening exams.  Did review patient's personal, surgical, social, family histories.  Patient is up-to-date on all age-appropriate vaccinations he would like.  Patient is up-to-date on CRC screening and prostate cancer screening.  He is followed by urology for prostate cancer and elevated PSA.  Patient was given information at discharge about preventative healthcare maintenance with anticipatory guidance.      Relevant Orders   TSH   Comprehensive metabolic panel   CBC   Obesity (BMI 30-39.9)   Pending TSH, A1c, lipid panel.  Continue work on healthy lifestyle modifications.  Patient was given medical recommendation of exercise of 30 minutes a day 5 times a week.  Also given information for the healthy weight wellness clinic at [facility] in [place]      Seasonal allergies   For outpatient levocetirizine 5 mg daily.      Relevant Medications   levocetirizine (XYZAL) 5 MG tablet   PND (post-nasal drip)   Will do fluticasone nasal spray 50 mcg per actuation 2 sprays each nostril daily along with levocetirizine 5 mg daily.      Relevant Medications   fluticasone (FLONASE) 50 MCG/ACT nasal spray   levocetirizine (XYZAL) 5 MG tablet   Elevated PSA   History of same and is being followed by urology.  Continue to follow with urology as recommended      Relevant Orders   PSA   Other Visit Diagnoses       Encounter for hepatitis C screening test for  low risk patient       Relevant Orders   Hepatitis C Antibody     Screening for prostate cancer       Relevant Orders   PSA     Screening for lipid disorders       Relevant Orders  Lipid panel     Screening for diabetes mellitus       Relevant Orders   Hemoglobin A1c       Return in about 1 year (around 02/06/2025) for CPE and Labs.    Audria Nine, NP

## 2024-02-07 NOTE — Assessment & Plan Note (Signed)
 Discussed age-appropriate immunizations and screening exams.  Did review patient's personal, surgical, social, family histories.  Patient is up-to-date on all age-appropriate vaccinations he would like.  Patient is up-to-date on CRC screening and prostate cancer screening.  He is followed by urology for prostate cancer and elevated PSA.  Patient was given information at discharge about preventative healthcare maintenance with anticipatory guidance.

## 2024-02-07 NOTE — Assessment & Plan Note (Signed)
 History of same and is being followed by urology.  Continue to follow with urology as recommended

## 2024-02-07 NOTE — Assessment & Plan Note (Signed)
 Send secondary to postnasal discharge.  Will start patient on daily second-generation histamine and fluticasone nasal spray.

## 2024-02-07 NOTE — Patient Instructions (Addendum)
 Nice to see you today I will be in touch with the labs once I have them Follow up with me in 1 year  Healthy weight and wellness  Address: [address] [place], [place], [place] [address] Phone: [phone]

## 2024-02-08 LAB — HEPATITIS C ANTIBODY: Hepatitis C Ab: NONREACTIVE

## 2024-02-09 ENCOUNTER — Encounter: Payer: Self-pay | Admitting: Nurse Practitioner

## 2024-02-21 ENCOUNTER — Telehealth: Payer: Self-pay | Admitting: Nurse Practitioner

## 2024-02-21 ENCOUNTER — Other Ambulatory Visit: Payer: Self-pay | Admitting: Nurse Practitioner

## 2024-02-21 DIAGNOSIS — J302 Other seasonal allergic rhinitis: Secondary | ICD-10-CM

## 2024-02-21 DIAGNOSIS — R0982 Postnasal drip: Secondary | ICD-10-CM

## 2024-02-21 NOTE — Telephone Encounter (Signed)
 Can we see if the xyzal and flonase has helped with the post nasal drip?

## 2024-02-21 NOTE — Telephone Encounter (Signed)
 Attempted to call patient, call was dropped

## 2024-02-21 NOTE — Telephone Encounter (Signed)
-----   Message from [facility] sent at 02/07/2024  8:49 AM EST ----- Regarding: PND See if xyzal and flonase has helped with PND

## 2024-02-22 MED ORDER — LEVOCETIRIZINE DIHYDROCHLORIDE 5 MG PO TABS
5.0000 mg | ORAL_TABLET | Freq: Every evening | ORAL | 0 refills | Status: DC
Start: 2024-02-22 — End: 2024-04-04

## 2024-02-22 MED ORDER — FLUTICASONE PROPIONATE 50 MCG/ACT NA SUSP
2.0000 | Freq: Every day | NASAL | 0 refills | Status: AC
Start: 2024-02-22 — End: ?

## 2024-02-23 NOTE — Telephone Encounter (Signed)
 Left detailed voicemail for patient to call the office back.

## 2024-02-25 NOTE — Telephone Encounter (Signed)
 Not able to leave voice mail at this time.

## 2024-02-28 NOTE — Telephone Encounter (Signed)
 Okay to send letter.

## 2024-02-28 NOTE — Telephone Encounter (Signed)
 Ok to send letter

## 2024-02-29 NOTE — Telephone Encounter (Signed)
Letter sent to patient address on file

## 2024-03-30 ENCOUNTER — Other Ambulatory Visit

## 2024-03-31 ENCOUNTER — Other Ambulatory Visit: Payer: Self-pay

## 2024-04-03 ENCOUNTER — Other Ambulatory Visit: Payer: Self-pay | Admitting: Nurse Practitioner

## 2024-04-03 DIAGNOSIS — J302 Other seasonal allergic rhinitis: Secondary | ICD-10-CM

## 2024-04-03 DIAGNOSIS — R0982 Postnasal drip: Secondary | ICD-10-CM

## 2024-04-04 ENCOUNTER — Ambulatory Visit: Payer: Self-pay | Admitting: Urology

## 2024-04-04 ENCOUNTER — Other Ambulatory Visit

## 2024-04-04 DIAGNOSIS — C61 Malignant neoplasm of prostate: Secondary | ICD-10-CM

## 2024-04-05 LAB — PSA: Prostate Specific Ag, Serum: 8.4 ng/mL — ABNORMAL HIGH (ref 0.0–4.0)

## 2024-04-11 ENCOUNTER — Ambulatory Visit (INDEPENDENT_AMBULATORY_CARE_PROVIDER_SITE_OTHER): Admitting: Urology

## 2024-04-11 VITALS — BP 142/92 | HR 80 | Ht 67.0 in | Wt 250.0 lb

## 2024-04-11 DIAGNOSIS — R972 Elevated prostate specific antigen [PSA]: Secondary | ICD-10-CM

## 2024-04-11 DIAGNOSIS — C61 Malignant neoplasm of prostate: Secondary | ICD-10-CM | POA: Diagnosis not present

## 2024-04-11 NOTE — Patient Instructions (Signed)
 Please contact Central Scheduling to set up your prostate MRI at [phone].  Prostate MRI Prep:  1- No ejaculation 48 hours prior to exam  2- No caffeine or carbonated beverages on day of the exam  3- Eat light diet evening prior and day of exam  4- Avoid eating 4 hours prior to exam  5- Fleets enema needs to be done 4 hours prior to exam -See below. Can be purchased at the drug store.

## 2024-04-11 NOTE — Progress Notes (Signed)
 04/11/2024 3:20 PM   Victor Carter Jul 04, 1966 [id number]  Referring provider: Dorothe Gaster, NP [address] [place],  [place] [address]  Chief Complaint  Patient presents with   Prostate Cancer    HPI: 58 year-old male with a personal history of low-volume prostate cancer, Gleason score 3+3, presents for six month follow-up.   At the time of diagnosis, his PSA was 6.39, and his truss volume was 37.9 grams. He was supposed to have an MRI scheduled after the last visit, but it was not completed due to a scheduling error.   His most recent PSA on 04/04/2024 is 8.4 and continues to rise.   He reports no new symptoms or urinary problems in the last six months.   PMH: Past Medical History:  Diagnosis Date   Allergy 12/07/2017   Chicken pox    GERD (gastroesophageal reflux disease) 12/07/2020   Sickle cell anemia (HCC)    trait    Surgical History: Past Surgical History:  Procedure Laterality Date   bone graft dental surgery right side  02/08/2023   COLONOSCOPY WITH PROPOFOL  N/A 01/15/2017   Procedure: COLONOSCOPY WITH PROPOFOL ;  Surgeon: Luke Salaam, MD;  Location: [facility] ENDOSCOPY;  Service: Endoscopy;  Laterality: N/A;   COLONOSCOPY WITH PROPOFOL  N/A 03/15/2023   Procedure: COLONOSCOPY WITH PROPOFOL ;  Surgeon: Luke Salaam, MD;  Location: [facility] ENDOSCOPY;  Service: Gastroenterology;  Laterality: N/A;   LEG TENDON SURGERY Left 1986   WISDOM TOOTH EXTRACTION      Home Medications:  Allergies as of 04/11/2024   No Known Allergies      Medication List        Accurate as of Apr 11, 2024  3:20 PM. If you have any questions, ask your nurse or doctor.          cholecalciferol 25 MCG (1000 UNIT) tablet Commonly known as: VITAMIN D3 Take 1,000 Units by mouth daily.   Fish Oil 1000 MG Caps Take by mouth.   fluticasone  50 MCG/ACT nasal spray Commonly known as: FLONASE  Place 2 sprays into both nostrils daily.   levocetirizine 5 MG tablet Commonly known as:  XYZAL  TAKE 1 TABLET BY MOUTH ONCE DAILY IN THE EVENING   multivitamin capsule Take 1 capsule by mouth daily.   TURMERIC PO Take by mouth.        Family History: Family History  Problem Relation Age of Onset   Breast cancer Mother    Hypertension Mother    Colon cancer Maternal Uncle        unsure butt thinks 50   Breast cancer Maternal Grandmother    Cancer Maternal Grandmother    Arthritis Paternal Grandmother    Hypertension Paternal Grandmother    Diabetes Neg Hx    Stroke Neg Hx     Social History:  reports that he has never smoked. He has never used smokeless tobacco. He reports current alcohol use of about 4.0 standard drinks of alcohol per week. He reports that he does not use drugs.   Physical Exam: BP (!) 142/92   Pulse 80   Ht 5\' 7"  (1.702 m)   Wt 250 lb (113.4 kg)   BMI 39.16 kg/m   Constitutional:  Alert and oriented, No acute distress. HEENT: [place] AT, moist mucus membranes.  Trachea midline, no masses. GU:  Firm prostatic apex on rectal exam, unable to palpate the base secondary to habitus. Neurologic: Grossly intact, no focal deficits, moving all 4 extremities. Psychiatric: Normal mood and affect.  Assessment & Plan:    1. Prostate Cancer with Rising PSA - His PSA has increased from 6.39 at diagnosis to 8.4, indicating a need for further evaluation. A prostate MRI is necessary to assess the extent of the disease and guide further management. The MRI order was placed, and he will be provided with the contact information to schedule the appointment. - Depending on the results, this may likely warrant a fusion biopsy.  Will discuss this at his follow-up.  Return in about 6 weeks (around 05/23/2024) for MRI results and discuss further management options..  I have reviewed the above documentation for accuracy and completeness, and I agree with the above.   Dustin Gimenez, MD   [facility] [address] [place], [place] [address] [phone]

## 2024-04-15 ENCOUNTER — Ambulatory Visit

## 2024-05-01 ENCOUNTER — Other Ambulatory Visit: Payer: Self-pay | Admitting: Nurse Practitioner

## 2024-05-01 DIAGNOSIS — R0982 Postnasal drip: Secondary | ICD-10-CM

## 2024-05-01 DIAGNOSIS — J302 Other seasonal allergic rhinitis: Secondary | ICD-10-CM

## 2024-05-03 ENCOUNTER — Encounter (INDEPENDENT_AMBULATORY_CARE_PROVIDER_SITE_OTHER): Payer: Self-pay | Admitting: Physician Assistant

## 2024-05-03 ENCOUNTER — Ambulatory Visit (INDEPENDENT_AMBULATORY_CARE_PROVIDER_SITE_OTHER): Admitting: Physician Assistant

## 2024-05-03 VITALS — BP 139/84 | HR 113 | Temp 98.3°F | Ht 67.0 in | Wt 247.0 lb

## 2024-05-03 DIAGNOSIS — E66812 Obesity, class 2: Secondary | ICD-10-CM

## 2024-05-03 DIAGNOSIS — Z8546 Personal history of malignant neoplasm of prostate: Secondary | ICD-10-CM

## 2024-05-03 DIAGNOSIS — K429 Umbilical hernia without obstruction or gangrene: Secondary | ICD-10-CM

## 2024-05-03 DIAGNOSIS — K219 Gastro-esophageal reflux disease without esophagitis: Secondary | ICD-10-CM

## 2024-05-03 DIAGNOSIS — C61 Malignant neoplasm of prostate: Secondary | ICD-10-CM

## 2024-05-03 DIAGNOSIS — Z6838 Body mass index (BMI) 38.0-38.9, adult: Secondary | ICD-10-CM

## 2024-05-03 DIAGNOSIS — Z0289 Encounter for other administrative examinations: Secondary | ICD-10-CM

## 2024-05-03 DIAGNOSIS — G4700 Insomnia, unspecified: Secondary | ICD-10-CM

## 2024-05-03 DIAGNOSIS — Z860100 Personal history of colon polyps, unspecified: Secondary | ICD-10-CM

## 2024-05-03 DIAGNOSIS — N529 Male erectile dysfunction, unspecified: Secondary | ICD-10-CM

## 2024-05-03 NOTE — Progress Notes (Unsigned)
 Office: [phone]  /  Fax: [phone]   Initial Visit    Kin Galbraith was seen in clinic today to evaluate for obesity. He is interested in losing weight to improve overall health and reduce the risk of weight related complications. He presents today to review program treatment options, initial physical assessment, and evaluation.     Deklin Bieler is a 58 year old male who presents for initial evaluation for obesity treatment.  He seeks to lose at least 40 pounds over the next six to nine months. His current weight is 247 pounds, the highest he has ever been. 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  He has no history of high blood pressure, high cholesterol, fatty liver disease, diabetes, heart disease, lung disease, kidney disease, or vitamin D deficiency. He is motivated to lose weight to prevent potential health issues like high blood pressure and diabetes.  He reports no significant issues with eating patterns, stating he does not eat a lot and does not frequently skip meals. His wife helps ensure he eats regularly. No significant mood changes related to his weight, and he is not particularly anxious or depressed about it.  His weight has affected his mobility, and he needs to improve his endurance. He currently engages in some walking at work but does not have a regular exercise routine. He is interested in starting a gym routine to increase his physical activity.  No family history of obesity. His mother was slim most of her life until her late forties. No history of sleep disordered breathing or being tested for sleep apnea.  He is not currently on any medications and has not participated in any weight loss programs such as Weight Watchers or Nutrisystem. He is not following any specific nutrition plan but has reduced his intake of breakfast bars and fast  foods.  He was referred by: PCP  When asked what else they would like to accomplish? He states: Adopt healthier eating patterns, Improve energy levels and physical activity, Improve existing medical conditions, Improve quality of life, Improve appearance, Improve self-confidence, and Lose a target amount of weight : lose 40  lbs in 6-9 months.  When asked how has your weight affected you? He states: Has affected self-esteem, Having fatigue, and Having poor endurance  Weight history: He seeks to lose at least 40 pounds over the next six to nine months. His current weight is 247 pounds, the highest he has ever been. 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  He has no history of high blood pressure, high cholesterol, fatty liver disease, diabetes, heart disease, lung disease, kidney disease, or vitamin D deficiency. He is motivated to lose weight to prevent potential health issues like high blood pressure and diabetes.  Highest weight: 247 lbs  Some associated conditions: None He does have a history of Prostate Ca and colon polyps and is following regularly with his doctors for management. Recent elevated PSA and has MRI pending for further evaluation.   Contributing factors: disruption of circadian rhythm / sleep disordered breathing, consumption of processed foods, moderate to high levels of stress, reduced physical acitivity, and slow metabolism for age  Weight promoting medications identified: None  Prior weight loss attempts: None  Current nutrition plan:  None  Current level of physical activity: NEAT  Current or previous pharmacotherapy: None  Response to medication: Never tried medications   Past medical history includes:   Past Medical History:  Diagnosis Date   Allergy 12/07/2017   Chicken pox    GERD (gastroesophageal reflux disease) 12/07/2020   Sickle  cell anemia (HCC)    trait     Objective    BP 139/84   Pulse (!) 113   Temp 98.3 F (36.8 C)   Ht 5\' 7"  (1.702 m)   Wt 247 lb (112 kg)   SpO2 96%   BMI 38.69 kg/m  He was weighed on the bioimpedance scale: Body mass index is 38.69 kg/m.  Body Fat%:33.6, Visceral Fat Rating:21, Weight trend over the last 12 months: Unchanged  General:  Alert, oriented and cooperative. Patient is in no acute distress.  Respiratory: Normal respiratory effort, no problems with respiration noted   Gait: able to ambulate independently  Mental Status: Normal mood and affect. Normal behavior. Normal judgment and thought content.   DIAGNOSTIC DATA REVIEWED:  BMET    Component Value Date/Time   NA 143 02/07/2024 0900   K 4.3 02/07/2024 0900   CL 104 02/07/2024 0900   CO2 27 02/07/2024 0900   GLUCOSE 96 02/07/2024 0900   BUN 13 02/07/2024 0900   CREATININE 1.37 02/07/2024 0900   CALCIUM 9.8 02/07/2024 0900   GFRNONAA 57 (L) 03/08/2019 1026   GFRAA >60 03/08/2019 1026   Lab Results  Component Value Date   HGBA1C 4.9 02/07/2024   HGBA1C 4.6 11/14/2015   No results found for: "INSULIN" CBC    Component Value Date/Time   WBC 6.3 02/07/2024 0900   RBC 4.55 02/07/2024 0900   HGB 14.9 02/07/2024 0900   HCT 45.3 02/07/2024 0900   PLT 243.0 02/07/2024 0900   MCV 99.5 02/07/2024 0900   MCH 31.9 03/08/2019 1026   MCHC 32.8 02/07/2024 0900   RDW 13.1 02/07/2024 0900   Iron/TIBC/Ferritin/ %Sat No results found for: "IRON", "TIBC", "FERRITIN", "IRONPCTSAT" Lipid Panel     Component Value Date/Time   CHOL 194 02/07/2024 0900   TRIG 146.0 02/07/2024 0900   HDL 52.20 02/07/2024 0900   CHOLHDL 4 02/07/2024 0900   VLDL 29.2 02/07/2024 0900   LDLCALC 113 (H) 02/07/2024 0900   LDLDIRECT 99.0 12/21/2019 1506   Hepatic Function Panel     Component Value Date/Time   PROT 7.5 02/07/2024 0900   ALBUMIN 4.5 02/07/2024 0900   AST 25 02/07/2024 0900   ALT 37 02/07/2024 0900   ALKPHOS 71  02/07/2024 0900   BILITOT 0.8 02/07/2024 0900      Component Value Date/Time   TSH 2.24 02/07/2024 0900     Assessment and Plan   Personal history of prostate cancer  Insomnia, unspecified type  Gastroesophageal reflux disease without esophagitis  Erectile dysfunction, unspecified erectile dysfunction type  History of colonic polyps  Umbilical hernia without obstruction and without gangrene  Class 2 severe obesity due to excess calories with serious comorbidity and body mass index (BMI) of 38.0 to 38.9 in adult [facility])  Current BMI 38.7  Assessment and Plan Assessment & Plan  Obesity Obesity with a current weight of 247 pounds, the highest recorded. He has experienced gradual weight gain over 20 years due to a sedentary lifestyle. No current hypertension, diabetes, or hyperlipidemia, but there is a risk for these conditions due to obesity. He aims to lose at least 40 pounds in 6 to  9 months to improve self-esteem, appearance, and health. Visceral adipose rating is 21, indicating risk for type 2 diabetes, hypertension, and heart disease. He has not engaged in formal weight loss programs or specific nutrition plans. Potential inadequate protein intake and slow metabolism may contribute to weight retention. A metabolism test will determine daily caloric needs for a personalized nutrition plan. - Conduct a metabolism test  determining daily caloric needs. - Develop a personalized nutrition plan based on metabolism test results, focusing on appropriate calorie deficit and macronutrient distribution. - Schedule initial testing appointment in the morning, requiring an 8-hour fast and no caffeine intake/ no exercise prior to the test. - Set up follow-up appointments every 2-3 weeks for the first 3 months to monitor progress and adjust the plan as needed. - Encourage increased physical activity, potentially including gym workouts. - Discuss the importance of protein intake and evaluate  current dietary habits.       Obesity Treatment / Action Plan:  Patient will work on garnering support from family and friends to begin weight loss journey. Will work on eliminating or reducing the presence of highly palatable, calorie dense foods in the home. Will complete provided nutritional and psychosocial assessment questionnaire before the next appointment. Will be scheduled for indirect calorimetry to determine resting energy expenditure in a fasting state.  This will allow us  to create a reduced calorie, high-protein meal plan to promote loss of fat mass while preserving muscle mass. Will think about ideas on how to incorporate physical activity into their daily routine. Will work on reducing intake of added sugars, simple sugars and processed carbs. Will avoid skipping meals which may result in increased hunger signals and overeating at certain times. Will reduce the frequency of eating out and making healthier choices by advanced menu planning. Will work on reading labels, making healthier choices and watching portion sizes. Counseled on the health benefits of losing 5%-15% of total body weight. Will work on improving sleep hygiene and trying to obtain at least 7 hours of sleep. Was counseled on nutritional approaches to weight loss and benefits of reducing processed foods and consuming plant-based foods and high quality protein as part of nutritional weight management. Was counseled on pharmacotherapy and role as an adjunct in weight management.   Obesity Education Performed Today:  He was weighed on the bioimpedance scale and results were discussed and documented in the synopsis.  We discussed obesity as a disease and the importance of a more detailed evaluation of all the factors contributing to the disease.  We discussed the importance of long term lifestyle changes which include nutrition, exercise and behavioral modifications as well as the importance of customizing this to  his specific health and social needs.  We discussed the benefits of reaching a healthier weight to alleviate the symptoms of existing conditions and reduce the risks of the biomechanical, metabolic and psychological effects of obesity.  We reviewed the four pillars of obesity medicine and importance of using a multimodal approach.  We reviewed the basic principles in weight management.   Izan Miron appears to be in the action stage of change and states they are ready to start intensive lifestyle modifications and behavioral modifications.  I have spent 35 minutes in the care of the patient today including: 3 minutes before the visit reviewing and preparing the chart. 25 minutes face-to-face assessing and reviewing listed medical problems as outlined in obesity care plan, providing nutritional and behavioral counseling on topics outlined in the obesity care plan,  independently interpreting test results and goals of care, as described in assessment and plan, reviewing and discussing biometric information and progress, and reviewing latest PCP notes and specialist consultations 7 minutes after the visit updating chart and documentation of encounter.  Reviewed by clinician on day of visit: allergies, medications, problem list, medical history, surgical history, family history, social history, and previous encounter notes pertinent to obesity diagnosis.   Malek Skog,PA-C

## 2024-05-06 ENCOUNTER — Ambulatory Visit
Admission: RE | Admit: 2024-05-06 | Discharge: 2024-05-06 | Disposition: A | Discharge status: Home / Self Care | Source: Ambulatory Visit | Attending: Urology | Admitting: Urology

## 2024-05-06 DIAGNOSIS — C61 Malignant neoplasm of prostate: Secondary | ICD-10-CM | POA: Insufficient documentation

## 2024-05-06 DIAGNOSIS — N4289 Other specified disorders of prostate: Secondary | ICD-10-CM | POA: Diagnosis not present

## 2024-05-06 DIAGNOSIS — R972 Elevated prostate specific antigen [PSA]: Secondary | ICD-10-CM | POA: Insufficient documentation

## 2024-05-06 MED ORDER — GADOBUTROL 1 MMOL/ML IV SOLN
10.0000 mL | Freq: Once | INTRAVENOUS | Status: AC | PRN
  Administered 2024-05-06: 10 mL via INTRAVENOUS

## 2024-05-23 ENCOUNTER — Encounter: Payer: Self-pay | Admitting: Physician Assistant

## 2024-05-23 ENCOUNTER — Ambulatory Visit (INDEPENDENT_AMBULATORY_CARE_PROVIDER_SITE_OTHER): Admitting: Physician Assistant

## 2024-05-23 VITALS — BP 147/85 | HR 88 | Ht 67.0 in | Wt 242.0 lb

## 2024-05-23 DIAGNOSIS — R972 Elevated prostate specific antigen [PSA]: Secondary | ICD-10-CM

## 2024-05-23 NOTE — Patient Instructions (Signed)
 Transrectal Prostate Biopsy/Fusion Biopsy Patient Education and Post Procedure Instructions    -Definition A prostate biopsy is the removal of a small amount of tissue from the prostate gland. The tissue is examined to determine whether there is cancer.  -Reasons for Procedure A prostate biopsy is usually done after an abnormal finding by: Digital rectal exam Prostate specific antigen (PSA) blood test A prostate biopsy is the only way to find out if cancer cells are present.  -Possible Complications Problems from the procedure are rare, but all procedures have some risk including: Infection Bruising or lengthy bleeding from the rectum, or in urine or semen Difficulty urinating Reactions to anesthesia Factors that may increase the risk of complications include: Smoking History of bleeding disorders or easy bruising Use of any medications, over-the-counter medications, or herbal supplements Sensitivity or allergy to latex, medications, or anesthesia.  -Prior to Procedure Talk to your doctor about your medications. Blood thinning medications including aspirin should be stopped 1 week prior to procedure. If prescribed by your cardiologist we may need approval before stopping medications. Use a Fleets enema 2 hours before the procedure. Can be purchased at your pharmacy. Antibiotics will be administered in the clinic prior to procedure.  Please make sure you eat a light meal prior to coming in for your appointment. This can help prevent lightheadedness during the procedure and upset stomach from antibiotics. Please bring someone with you to the procedure to drive you home.  -Anesthesia Transrectal biopsy: Local anesthesia--Just the area that is being operated on is numbed using an injectable anesthetic.  -Description of the Procedure Transrectal biopsy--Your doctor will insert a small ultrasound device into the rectum. This device will produce sound waves to create an image of the  prostate. These images will help guide placement of the needle. Your doctor will then insert the needle through the wall of the rectum and into the prostate gland. The procedure should take approximately 15-30 minutes.  -Will It Hurt? You may have discomfort and soreness at the biopsy site. Pain and discomfort after the procedure can be managed with medications.  -Postoperative Care When you return home after the procedure, do the following to help ensure a smooth recovery: Stay hydrated. Drink plenty of fluids for the next few days. Avoid difficult physical activity the day and evening of the procedure. Keep in mind that you may see blood in your urine, stool, or semen for several days. Resume any medications that were stopped when you are advised to do so.  After the sample is taken, it will be sent to a pathologist for examination under a microscope. This doctor will analyze the sample for cancer. You will be scheduled for an appointment to discuss results. If cancer is present, your doctor will work with you to develop a treatment plan.   -Call Your Doctor or Seek Immediate Medical Attention It is important to monitor your recovery. Alert your doctor to any problems. If any of the following occur, call your doctor or go to the emergency room: Fever 100.5 or greater within 1 week post procedure go directly to ER Call the office for: Blood in the urine more than 1 week or in semen for more than 6 weeks post-biopsy Pain that you cannot control with the medications you have been given Pain, burning, urgency, or frequency of urination Cough, shortness of breath, or chest pain- if severe go to ER Heavy rectal bleeding or bleeding that lasts more than 1 week after the biopsy If you have  any questions or concerns please contact our office at [phone]

## 2024-05-23 NOTE — Progress Notes (Signed)
 05/23/2024 5:21 PM   Victor Carter 02/17/1966 [id number]  CC: Chief Complaint  Patient presents with   Results   HPI: Victor Carter is a 58 y.o. male with PMH low-volume, low risk prostate cancer with rising PSA who presents today for prostate MRI results.   Today he reports no acute concerns.  MRI prostate dated 05/06/2024 showed 1 suspicious PI-RADS 4 lesion in the posterior lateral right mid gland peripheral zone.  He also had an enlarged nodular transition zone consistent with BPH.  PMH: Past Medical History:  Diagnosis Date   Allergy 12/07/2017   Chicken pox    GERD (gastroesophageal reflux disease) 12/07/2020   Sickle cell anemia (HCC)    trait    Surgical History: Past Surgical History:  Procedure Laterality Date   bone graft dental surgery right side  02/08/2023   COLONOSCOPY WITH PROPOFOL  N/A 01/15/2017   Procedure: COLONOSCOPY WITH PROPOFOL ;  Surgeon: Luke Salaam, MD;  Location: [facility] ENDOSCOPY;  Service: Endoscopy;  Laterality: N/A;   COLONOSCOPY WITH PROPOFOL  N/A 03/15/2023   Procedure: COLONOSCOPY WITH PROPOFOL ;  Surgeon: Luke Salaam, MD;  Location[facility] ENDOSCOPY;  Service: Gastroenterology;  Laterality: N/A;   LEG TENDON SURGERY Left 1986   WISDOM TOOTH EXTRACTION      Home Medications:  Allergies as of 05/23/2024   No Known Allergies      Medication List        Accurate as of May 23, 2024  5:21 PM. If you have any questions, ask your nurse or doctor.          STOP taking these medications    TURMERIC PO Stopped by: Kathreen Pare       TAKE these medications    cholecalciferol 25 MCG (1000 UNIT) tablet Commonly known as: VITAMIN D3 Take 1,000 Units by mouth daily.   Fish Oil 1000 MG Caps Take by mouth.   fluticasone  50 MCG/ACT nasal spray Commonly known as: FLONASE  Place 2 sprays into both nostrils daily.   levocetirizine 5 MG tablet Commonly known as: XYZAL  TAKE 1 TABLET BY MOUTH ONCE DAILY IN THE EVENING    multivitamin capsule Take 1 capsule by mouth daily.        Allergies:  No Known Allergies  Family History: Family History  Problem Relation Age of Onset   Breast cancer Mother    Hypertension Mother    Colon cancer Maternal Uncle        unsure butt thinks 50   Breast cancer Maternal Grandmother    Cancer Maternal Grandmother    Arthritis Paternal Grandmother    Hypertension Paternal Grandmother    Diabetes Neg Hx    Stroke Neg Hx     Social History:   reports that he has never smoked. He has never used smokeless tobacco. He reports current alcohol use of about 4.0 standard drinks of alcohol per week. He reports that he does not use drugs.  Physical Exam: BP (!) 147/85   Pulse 88   Ht 5' 7 (1.702 m)   Wt 242 lb (109.8 kg)   BMI 37.90 kg/m   Constitutional:  Alert and oriented, no acute distress, nontoxic appearing HEENT: [place], AT Cardiovascular: No clubbing, cyanosis, or edema Respiratory: Normal respiratory effort, no increased work of breathing Skin: No rashes, bruises or suspicious lesions Neurologic: Grossly intact, no focal deficits, moving all 4 extremities Psychiatric: Normal mood and affect  Assessment & Plan:   1. Elevated PSA (Primary) Known low-volume, low risk prostate cancer, now with  rising PSA and a PI-RADS 4 lesion on prostate MRI.  I recommended pursuing fusion biopsy at this time and he agreed.  We discussed common postbiopsy findings including hematospermia, hematochezia, and hematuria.  We discussed the risk of postbiopsy sepsis and I encouraged him to seek care in the emergency department if he develops a fever greater than 101 F after the biopsy.  He prefers to pursue biopsy ASAP, will refer to [facility] for assistance with this. - Ambulatory referral to Urology   Return for [facility] will call to schedule Fusion biopsy.  Kathreen Pare, PA-C  [facility] [place] [address] [place], [place] [address] [phone]

## 2024-06-06 ENCOUNTER — Encounter (INDEPENDENT_AMBULATORY_CARE_PROVIDER_SITE_OTHER): Payer: Self-pay

## 2024-06-21 DIAGNOSIS — N411 Chronic prostatitis: Secondary | ICD-10-CM | POA: Diagnosis not present

## 2024-06-21 DIAGNOSIS — C61 Malignant neoplasm of prostate: Secondary | ICD-10-CM | POA: Diagnosis not present

## 2024-07-03 ENCOUNTER — Ambulatory Visit (INDEPENDENT_AMBULATORY_CARE_PROVIDER_SITE_OTHER): Admitting: Internal Medicine

## 2024-07-05 ENCOUNTER — Telehealth: Payer: Self-pay | Admitting: Radiation Oncology

## 2024-07-05 NOTE — Telephone Encounter (Signed)
Left message for patient to call back to schedule consult per 7/29 referral.

## 2024-07-06 ENCOUNTER — Telehealth: Payer: Self-pay | Admitting: Radiation Oncology

## 2024-07-06 NOTE — Telephone Encounter (Signed)
Left message for patient to call back to schedule consult per 7/29 referral.

## 2024-07-07 ENCOUNTER — Telehealth: Payer: Self-pay | Admitting: Radiation Oncology

## 2024-07-07 NOTE — Telephone Encounter (Signed)
Left message for patient to call back to schedule consult per 7/29 referral.

## 2024-07-10 ENCOUNTER — Telehealth: Payer: Self-pay | Admitting: Radiation Oncology

## 2024-07-10 NOTE — Telephone Encounter (Signed)
 Mailed letter for patient to call back to schedule consult per 7/29 referral.

## 2024-07-13 DIAGNOSIS — C61 Malignant neoplasm of prostate: Secondary | ICD-10-CM | POA: Diagnosis not present

## 2024-07-17 ENCOUNTER — Ambulatory Visit (INDEPENDENT_AMBULATORY_CARE_PROVIDER_SITE_OTHER): Admitting: Internal Medicine

## 2024-08-01 ENCOUNTER — Encounter (INDEPENDENT_AMBULATORY_CARE_PROVIDER_SITE_OTHER): Payer: Self-pay

## 2024-08-01 NOTE — Progress Notes (Incomplete)
 GU Location of Tumor / Histology: Prostate Ca  If Prostate Cancer, Gleason Score is (3 + 4) and PSA is (8.4 on 04/04/2024)  Belvie Music presented as referral from Dr. Norleen Seltzer [facility]) elevated PSA.  Biopsies       05/06/2024 Dr. Rosina Riis MR Prostate with/without Contrast CLINICAL DATA: Prostate cancer. Elevated PSA   IMPRESSION: 1. Lesion the posterior lateral RIGHT mid gland peripheral zone is suspicious for for high-grade prostate carcinoma. PI-RADS (v2.1): 4. (Dynacad 3D post processing performed). ROI #1. 2. Enlarged nodular transitional zone most consistent with benign prostate hypertrophy. PI-RADS(v2.1): 2   Past/Anticipated interventions by urology, if any: NA  Past/Anticipated interventions by medical oncology, if any: NA  Weight changes, if any: No  IPSS:  1 SHIM:  21  Bowel/Bladder complaints, if any:  No  Nausea/Vomiting, if any:  No  Pain issues, if any:  0/10  SAFETY ISSUES: Prior radiation?  No Pacemaker/ICD? No Possible current pregnancy? Male Is the patient on methotrexate? No  Current Complaints / other details:  None  30 minutes spent total, including time for meaningful use questions, reviewing medication, as well as spent in face-to-face time in nurse evaluation with the patient.

## 2024-08-03 ENCOUNTER — Encounter: Payer: Self-pay | Admitting: Urology

## 2024-08-03 DIAGNOSIS — C61 Malignant neoplasm of prostate: Secondary | ICD-10-CM | POA: Insufficient documentation

## 2024-08-04 ENCOUNTER — Ambulatory Visit
Admission: RE | Admit: 2024-08-04 | Discharge: 2024-08-04 | Disposition: A | Discharge status: Home / Self Care | Source: Ambulatory Visit | Attending: Radiation Oncology | Admitting: Radiation Oncology

## 2024-08-04 ENCOUNTER — Encounter: Payer: Self-pay | Admitting: Radiation Oncology

## 2024-08-04 VITALS — BP 147/96 | HR 89 | Temp 96.9°F | Resp 18 | Ht 67.0 in | Wt 253.2 lb

## 2024-08-04 DIAGNOSIS — Z800 Family history of malignant neoplasm of digestive organs: Secondary | ICD-10-CM | POA: Insufficient documentation

## 2024-08-04 DIAGNOSIS — D571 Sickle-cell disease without crisis: Secondary | ICD-10-CM | POA: Insufficient documentation

## 2024-08-04 DIAGNOSIS — Z803 Family history of malignant neoplasm of breast: Secondary | ICD-10-CM | POA: Insufficient documentation

## 2024-08-04 DIAGNOSIS — C61 Malignant neoplasm of prostate: Secondary | ICD-10-CM | POA: Diagnosis not present

## 2024-08-04 DIAGNOSIS — K219 Gastro-esophageal reflux disease without esophagitis: Secondary | ICD-10-CM | POA: Insufficient documentation

## 2024-08-04 DIAGNOSIS — Z79899 Other long term (current) drug therapy: Secondary | ICD-10-CM | POA: Insufficient documentation

## 2024-08-04 DIAGNOSIS — Z191 Hormone sensitive malignancy status: Secondary | ICD-10-CM | POA: Diagnosis not present

## 2024-08-04 NOTE — Progress Notes (Signed)
 Introduced myself to the patient, and his wife, as the prostate nurse navigator.  No barriers to care identified at this time.  He is here to discuss his radiation treatment options.  I gave him my business card and asked him to call me with questions or concerns.  Verbalized understanding. Patient agreeable to follow up next week, and does prefer e-mail for communication.

## 2024-08-04 NOTE — Progress Notes (Signed)
 Radiation Oncology         [phone] ________________________________  Initial Outpatient Consultation  Name: Victor Carter MRN: [id number]  Date: 08/04/2024  DOB: [birth year]  RR:Rjaoz, Lynwood HERO, NP  Watt Rush, MD   REFERRING PHYSICIAN: Watt Rush, MD  DIAGNOSIS: 58 y.o. gentleman with Stage T1c adenocarcinoma of the prostate with Gleason score of 3+4, and PSA of 8.4.    ICD-10-CM   1. Malignant neoplasm of prostate (HCC)  C61       HISTORY OF PRESENT ILLNESS: Victor Carter is a 58 y.o. male with a diagnosis of prostate cancer. He has a history of a rising PSA since 2019-- from 3.41 in 01/2018 to 4.66 in 12/2019 and 6.69 in 01/2023. Accordingly, he was referred for evaluation in urology by Dr. Penne on 02/12/23. A repeat PSA obtained that day showed persistent elevation at 6.31. He proceeded to prostate biopsy on 03/31/23, which showed low volume, low risk Gleason 3+3 prostate cancer. He was appropriately placed on active surveillance at that time. Since diagnosis, his PSA has continued to rise, most recently reaching 8.4 in 03/2024. This prompted a prostate MRI on 05/06/24 showing a PI-RADS 4 lesion in the posterolateral right mid gland peripheral zone. The patient proceeded to MRI fusion biopsy of the prostate on 06/21/24 under the care of Dr. Watt.  The prostate volume measured 36 cc.  Out of 15 core biopsies, 3 were positive.  The maximum Gleason score was 3+4, and this was seen in the right base lateral and 1 of 3 cores from the MRI ROI. Additionally, Gleason 3+3 was seen in the left apex lateral.  The patient reviewed the biopsy results with his urologist and met with Dr. Selma on 07/13/24 to discuss his surgical treatment options and  has kindly been referred today for discussion of potential radiation treatment options.  He is accompanied by his wife, Jola, for today's visit.  PREVIOUS RADIATION THERAPY: No  PAST MEDICAL HISTORY:  Past Medical History:  Diagnosis Date    Allergy 12/07/2017   Chicken pox    GERD (gastroesophageal reflux disease) 12/07/2020   Sickle cell anemia (HCC)    trait      PAST SURGICAL HISTORY: Past Surgical History:  Procedure Laterality Date   bone graft dental surgery right side  02/08/2023   COLONOSCOPY WITH PROPOFOL  N/A 01/15/2017   Procedure: COLONOSCOPY WITH PROPOFOL ;  Surgeon: Ruel Kung, MD;  Location: [facility] ENDOSCOPY;  Service: Endoscopy;  Laterality: N/A;   COLONOSCOPY WITH PROPOFOL  N/A 03/15/2023   Procedure: COLONOSCOPY WITH PROPOFOL ;  Surgeon: Kung Ruel, MD;  Location: [facility] ENDOSCOPY;  Service: Gastroenterology;  Laterality: N/A;   LEG TENDON SURGERY Left 1986   PROSTATE BIOPSY     WISDOM TOOTH EXTRACTION      FAMILY HISTORY:  Family History  Problem Relation Age of Onset   Breast cancer Mother    Hypertension Mother    Colon cancer Maternal Uncle        unsure butt thinks 50   Breast cancer Maternal Grandmother    Cancer Maternal Grandmother    Arthritis Paternal Grandmother    Hypertension Paternal Grandmother    Diabetes Neg Hx    Stroke Neg Hx     SOCIAL HISTORY:  Social History   Socioeconomic History   Marital status: Married    Spouse name: Contessa   Number of children: 0   Years of education: Not on file   Highest education level: Not on file  Occupational History  Not on file  Tobacco Use   Smoking status: Never   Smokeless tobacco: Never  Vaping Use   Vaping status: Never Used  Substance and Sexual Activity   Alcohol use: Yes    Alcohol/week: 4.0 standard drinks of alcohol    Types: 4 Shots of liquor per week    Comment: occasional-social   Drug use: No   Sexual activity: Yes  Other Topics Concern   Not on file  Social History Narrative   Fulltime: Supervisor [employer]      Hobbies:going to sports events, Education officer, community. Restoring car and riding motorcycle   Social Drivers of Health   Financial Resource Strain: Not on file  Food Insecurity: No Food Insecurity  (08/04/2024)   Hunger Vital Sign    Worried About Running Out of Food in the Last Year: Never true    Ran Out of Food in the Last Year: Never true  Transportation Needs: No Transportation Needs (08/04/2024)   PRAPARE - Administrator, Civil Service (Medical): No    Lack of Transportation (Non-Medical): No  Physical Activity: Not on file  Stress: Not on file  Social Connections: Not on file  Intimate Partner Violence: Not At Risk (08/04/2024)   Humiliation, Afraid, Rape, and Kick questionnaire    Fear of Current or Ex-Partner: No    Emotionally Abused: No    Physically Abused: No    Sexually Abused: No    ALLERGIES: Patient has no known allergies.  MEDICATIONS:  Current Outpatient Medications  Medication Sig Dispense Refill   cholecalciferol (VITAMIN D3) 25 MCG (1000 UNIT) tablet Take 1,000 Units by mouth daily.     fluticasone  (FLONASE ) 50 MCG/ACT nasal spray Place 2 sprays into both nostrils daily. 16 g 0   levocetirizine (XYZAL ) 5 MG tablet TAKE 1 TABLET BY MOUTH ONCE DAILY IN THE EVENING 90 tablet 2   Multiple Vitamin (MULTIVITAMIN) capsule Take 1 capsule by mouth daily.     Omega-3 Fatty Acids (FISH OIL) 1000 MG CAPS Take by mouth.     No current facility-administered medications for this encounter.    REVIEW OF SYSTEMS:  On review of systems, the patient reports that he is doing well overall. He denies any chest pain, shortness of breath, cough, fevers, chills, night sweats, unintended weight changes. He denies any bowel disturbances, and denies abdominal pain, nausea or vomiting. He denies any new musculoskeletal or joint aches or pains. His IPSS was 1, indicating minimal urinary symptoms. His SHIM was 21, indicating he has mild erectile dysfunction. A complete review of systems is obtained and is otherwise negative.    PHYSICAL EXAM:  Wt Readings from Last 3 Encounters:  08/04/24 253 lb 4 oz (114.9 kg)  05/23/24 242 lb (109.8 kg)  05/03/24 247 lb (112 kg)    Temp Readings from Last 3 Encounters:  08/04/24 (!) 96.9 F (36.1 C) (Temporal)  05/03/24 98.3 F (36.8 C)  02/07/24 98.4 F (36.9 C) (Oral)   BP Readings from Last 3 Encounters:  08/04/24 (!) 147/96  05/23/24 (!) 147/85  05/03/24 139/84   Pulse Readings from Last 3 Encounters:  08/04/24 89  05/23/24 88  05/03/24 (!) 113   Pain Assessment Pain Score: 0-No pain/10  In general this is a well appearing African American male in no acute distress. He's alert and oriented x4 and appropriate throughout the examination. Cardiopulmonary assessment is negative for acute distress, and he exhibits normal effort.     KPS = 100  100 -  Normal; no complaints; no evidence of disease. 90   - Able to carry on normal activity; minor signs or symptoms of disease. 80   - Normal activity with effort; some signs or symptoms of disease. 57   - Cares for self; unable to carry on normal activity or to do active work. 60   - Requires occasional assistance, but is able to care for most of his personal needs. 50   - Requires considerable assistance and frequent medical care. 40   - Disabled; requires special care and assistance. 30   - Severely disabled; hospital admission is indicated although death not imminent. 20   - Very sick; hospital admission necessary; active supportive treatment necessary. 10   - Moribund; fatal processes progressing rapidly. 0     - Dead  Karnofsky DA, Abelmann WH, Craver LS and Burchenal [facility] [phone]) The use of the nitrogen mustards in the palliative treatment of carcinoma: with particular reference to bronchogenic carcinoma Cancer 1 634-56  LABORATORY DATA:  Lab Results  Component Value Date   WBC 6.3 02/07/2024   HGB 14.9 02/07/2024   HCT 45.3 02/07/2024   MCV 99.5 02/07/2024   PLT 243.0 02/07/2024   Lab Results  Component Value Date   NA 143 02/07/2024   K 4.3 02/07/2024   CL 104 02/07/2024   CO2 27 02/07/2024   Lab Results  Component Value Date   ALT 37  02/07/2024   AST 25 02/07/2024   ALKPHOS 71 02/07/2024   BILITOT 0.8 02/07/2024     RADIOGRAPHY: No results found.    IMPRESSION/PLAN: 1. 59 y.o. gentleman with Stage T1c adenocarcinoma of the prostate with Gleason Score of 3+4, and PSA of 8.4. We discussed the patient's workup and outlined the nature of prostate cancer in this setting. The patient's T stage, Gleason's score, and PSA put him into the favorable intermediate risk group. Accordingly, he is eligible for a variety of potential treatment options including brachytherapy, 5.5 weeks of external radiation, or prostatectomy. We discussed the available radiation techniques, and focused on the details and logistics of delivery. We discussed and outlined the risks, benefits, short and long-term effects associated with radiotherapy and compared and contrasted these with prostatectomy. We discussed the role of SpaceOAR gel in reducing the rectal toxicity associated with radiotherapy. He appears to have a good understanding of his disease and our treatment recommendations which are of curative intent.  He and his wife were encouraged to ask questions that were answered to their stated satisfaction.  At the conclusion of our conversation, the patient remains undecided between brachytherapy and RALP regarding his treatment preference.  He would like to take some additional time to consider his options and has our contact information with plans to let us  know once he reaches a final decision.  We enjoyed meeting him and his wife today.  We will share our discussion with Dr. Watt and Dr. Selma and look forward to continuing to participate in his care.  We personally spent 70 minutes in this encounter including chart review, reviewing radiological studies, meeting face-to-face with the patient, entering orders and completing documentation.    Sabra MICAEL Rusk, PA-C    Donnice Barge, MD  [facility]  Radiation Oncology Direct Dial: [phone]   Fax: [phone] [email or web address]  Skype  [email or web address]   This document serves as a record of services personally performed by Donnice Barge, MD and Sabra Rusk, PA-C. It was created on their behalf by Izetta Neither, a trained medical scribe.  The creation of this record is based on the scribe's personal observations and the provider's statements to them. This document has been checked and approved by the attending provider.

## 2024-08-11 NOTE — Progress Notes (Signed)
 Follow up e-mail sent to review any treatment decisions or follow up questions after recent consult.

## 2024-08-17 NOTE — Progress Notes (Signed)
 RN left message for call back to follow up to review any treatment decision, or follow up questions.

## 2024-08-21 ENCOUNTER — Ambulatory Visit (INDEPENDENT_AMBULATORY_CARE_PROVIDER_SITE_OTHER): Admitting: Family Medicine

## 2024-08-21 ENCOUNTER — Encounter (INDEPENDENT_AMBULATORY_CARE_PROVIDER_SITE_OTHER): Payer: Self-pay | Admitting: Family Medicine

## 2024-08-21 VITALS — BP 137/86 | HR 85 | Temp 97.9°F | Ht 67.0 in | Wt 246.0 lb

## 2024-08-21 DIAGNOSIS — R739 Hyperglycemia, unspecified: Secondary | ICD-10-CM | POA: Diagnosis not present

## 2024-08-21 DIAGNOSIS — R5383 Other fatigue: Secondary | ICD-10-CM

## 2024-08-21 DIAGNOSIS — E782 Mixed hyperlipidemia: Secondary | ICD-10-CM

## 2024-08-21 DIAGNOSIS — Z6838 Body mass index (BMI) 38.0-38.9, adult: Secondary | ICD-10-CM | POA: Diagnosis not present

## 2024-08-21 DIAGNOSIS — Z1331 Encounter for screening for depression: Secondary | ICD-10-CM

## 2024-08-21 DIAGNOSIS — R0602 Shortness of breath: Secondary | ICD-10-CM | POA: Diagnosis not present

## 2024-08-21 DIAGNOSIS — E66812 Obesity, class 2: Secondary | ICD-10-CM | POA: Diagnosis not present

## 2024-08-21 DIAGNOSIS — Z8546 Personal history of malignant neoplasm of prostate: Secondary | ICD-10-CM

## 2024-08-21 NOTE — Progress Notes (Signed)
 Chief Complaint:  Obesity   Subjective:  Victor Carter (MR# [id number]) is a 58 y.o. male who presents for evaluation and treatment of obesity and related comorbidities.   Victor Carter is currently in the action stage of change and ready to dedicate time achieving and maintaining a healthier weight. Victor Carter is interested in becoming our patient and working on intensive lifestyle modifications including (but not limited to) diet and exercise for weight loss.  Victor Carter has been struggling with his weight. He has been unsuccessful in either losing weight, maintaining weight loss, or reaching his healthy weight goal.  Referred by Lynwood Crandall.  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  Married and lives at home with his wife Contessa. She is supportive of him, eats meals with him an will be eating healthier with him.  Desired weight is 190lbs.  Last time he was that weight was 7-8 years ago.  Steady weight gain since that time. Wife does most of the cooking.   Food recall: Fruit in the am- berries, oranges, clementines.  May have a biscuit with sausage, egg and cheese from [employer].  May have tea or soda with it. May take one piece of the biscuit off and will drink the soda over the course of the day.   If eats sandwich and fruit and drink will feel satisfied.  Will eat again around 12:30- may be chicken, baked potato.  Always eats a meat and a side. One thigh, one leg or 1 pork chop or 1 burger.  1 cup of side; baked potato with sour cream, butter and a bit of cheese or salad with cucumbers tomato.  Feels satisfied.  Eats again around 6pm.  Inconsistently has a snack.  Will eat similar to lunch- foods and quantity.  Feels satisfied.   Indirect Calorimeter completed today shows a RMR: 1886  .  His calculated basal metabolic rate is 7756 thus his basal metabolic rate is worse than expected.  Other Fatigue Victor Carter admits to daytime somnolence and admits to  waking up still tired. Patient has a history of symptoms of daytime fatigue. Victor Carter generally gets 5 or 6 hours of sleep per night, and states that he has generally restless sleep. Snoring is present. Apneic episodes are present. Epworth Sleepiness Score is 6.   Shortness of Breath Victor Carter notes increasing shortness of breath with exercising and seems to be worsening over time with weight gain. He notes getting out of breath sooner with activity than he used to. This has not gotten worse recently. Victor Carter denies shortness of breath at rest or orthopnea.  Depression Screen Victor Carter's Food and Mood (modified PHQ-9) score was 5.     08/21/2024    7:39 AM  Depression screen PHQ 2/9  Decreased Interest 1  Down, Depressed, Hopeless 0  PHQ - 2 Score 1  Altered sleeping 2  Tired, decreased energy 2  Change in appetite 0  Feeling bad or failure about yourself  0  Trouble concentrating 0  Moving slowly or fidgety/restless 0  Suicidal thoughts 0  PHQ-9 Score 5  Difficult doing work/chores Not difficult at all     Objective:  Vitals Temp: 97.9 F (36.6 C) BP: 137/86 Pulse Rate: 85 SpO2: 97 %   Anthropometric Measurements Height: 5' 7 (1.702 m) Weight: 246 lb (111.6 kg) BMI (Calculated): 38.52 Starting Weight: 246 lb Peak Weight: 246 lb Waist Measurement : 49 inches   Body Composition  Body Fat %: 33 %  Fat Mass (lbs): 81.4 lbs Muscle Mass (lbs): 157 lbs Total Body Water (lbs): 111 lbs Visceral Fat Rating : 20   Other Clinical Data RMR: 1886 Fasting: yes Labs: yes Today's Visit #: 1 Starting Date: 08/21/24 Comments: 1    EKG: Normal sinus rhythm, rate 83.  T wave inversions in anterior leads and V4-V6- this is unchanged from 2020 ECG which was reviewed.  General: Cooperative, alert, well developed, in no acute distress. HEENT: Conjunctivae and lids unremarkable. Cardiovascular: Regular rhythm.  Lungs: Normal work of breathing. Neurologic: No focal deficits.    Lab Results  Component Value Date   CREATININE 1.37 02/07/2024   BUN 13 02/07/2024   NA 143 02/07/2024   K 4.3 02/07/2024   CL 104 02/07/2024   CO2 27 02/07/2024   Lab Results  Component Value Date   ALT 37 02/07/2024   AST 25 02/07/2024   ALKPHOS 71 02/07/2024   BILITOT 0.8 02/07/2024   Lab Results  Component Value Date   HGBA1C 4.9 02/07/2024   HGBA1C 4.8 02/03/2023   HGBA1C 4.7 12/21/2019   HGBA1C 4.6 01/12/2018   HGBA1C 4.6 11/14/2015   No results found for: INSULIN  Lab Results  Component Value Date   TSH 2.24 02/07/2024   Lab Results  Component Value Date   CHOL 194 02/07/2024   HDL 52.20 02/07/2024   LDLCALC 113 (H) 02/07/2024   LDLDIRECT 99.0 12/21/2019   TRIG 146.0 02/07/2024   CHOLHDL 4 02/07/2024   Lab Results  Component Value Date   WBC 6.3 02/07/2024   HGB 14.9 02/07/2024   HCT 45.3 02/07/2024   MCV 99.5 02/07/2024   PLT 243.0 02/07/2024   No results found for: IRON, TIBC, FERRITIN  Assessment and Plan:  Assessment & Plan Personal history of prostate cancer Patient is awaiting various treatment pathways which include radiation, radical prostatectomy or chemotherapy placed pellets.  He is leaning more towards pellets but has not made a final decision at this time.  Will follow-up at next appointment as to what patient has decided and what treatment plan and timing is. Mixed hyperlipidemia Previously elevated cholesterols which patient attributes to overindulge in eating habits.  Will repeat fasting lipid panel today and risk stratify and ASCVD risk calculator at next appointment.  Will discuss labs at that time. Hyperglycemia History of intermittently and sporadically elevated blood sugars.  Patient attributes that to increase in drinking during COVID.  Will repeat A1c, and fasting insulin  level today.  As well as comprehensive metabolic panel. Other fatigue  SOBOE (shortness of breath on exertion)  Class 2 severe obesity due to  excess calories with serious comorbidity and body mass index (BMI) of 38.0 to 38.9 in adult (HCC)  BMI 38.0-38.9,adult  Depression screening    Other Fatigue  Victor Carter does feel that his weight is causing his energy to be lower than it should be. Fatigue may be related to obesity, depression or many other causes. Labs will be ordered, and in the meanwhile, Victor Carter will focus on self care including making healthy food choices, increasing physical activity and focusing on stress reduction.  Shortness of Breath  Victor Carter does feel that he gets out of breath more easily that he used to when he exercises. Victor Carter's shortness of breath appears to be obesity related and exercise induced. He has agreed to work on weight loss and gradually increase exercise to treat his exercise induced shortness of breath. Will continue to monitor closely.   Problem List Items Addressed This Visit  None Visit Diagnoses       SOBOE (shortness of breath on exertion)    -  Primary   Relevant Orders   EKG 12-Lead   CBC with Differential/Platelet     Personal history of prostate cancer         Mixed hyperlipidemia       Relevant Orders   Lipid Panel With LDL/HDL Ratio     Hyperglycemia       Relevant Orders   Comprehensive metabolic panel with GFR   Hemoglobin A1c   Insulin , random     Other fatigue       Relevant Orders   EKG 12-Lead   Vitamin B12   T4, free   T3   Folate   VITAMIN D  25 Hydroxy (Vit-D Deficiency, Fractures)   TSH     Depression screening         Class 2 severe obesity due to excess calories with serious comorbidity and body mass index (BMI) of 38.0 to 38.9 in adult (HCC)         BMI 38.0-38.9,adult           Victor Carter is currently in the action stage of change and his goal is to continue with weight loss efforts. I recommend Victor Carter begin the structured treatment plan as follows:  He has agreed to Category 3 Plan  Exercise goals: All adults should avoid inactivity. Some  activity is better than none, and adults who participate in any amount of physical activity, gain some health benefits.  Behavioral modification strategies:increasing lean protein intake, decreasing simple carbohydrates, increasing vegetables, meal planning and cooking strategies, and keeping healthy foods in the home  He was informed of the importance of frequent follow-up visits to maximize his success with intensive lifestyle modifications for his multiple health conditions. He was informed we would discuss his lab results at his next visit unless there is a critical issue that needs to be addressed sooner. Victor Carter agreed to keep his next visit at the agreed upon time to discuss these results.  Labs ordered with plans to discuss at the next visit.   Attestation Statements:  Reviewed by clinician on day of visit: allergies, medications, problem list, medical history, surgical history, family history, social history, and previous encounter notes. This is the patient's first visit at [facility]. The patient's NEW PATIENT PACKET was reviewed at length. Included in the packet: current and past health history, medications, allergies, ROS, gynecologic history (women only), surgical history, family history, social history, weight history, weight loss surgery history (for those that have had weight loss surgery), nutritional evaluation, mood and food questionnaire, PHQ9, Epworth questionnaire, sleep habits questionnaire, patient life and health improvement goals questionnaire. These will all be scanned into the patient's chart under media.   During the visit, I independently reviewed the patient's EKG, bioimpedance scale results, and indirect calorimeter results. I used this information to tailor a meal plan for the patient that will help him to lose weight and will improve his obesity-related conditions going forward. I performed a medically necessary appropriate examination and/or  evaluation. I discussed the assessment and treatment plan with the patient. The patient was provided an opportunity to ask questions and all were answered. The patient agreed with the plan and demonstrated an understanding of the instructions. Labs were ordered at this visit and will be reviewed at the next visit unless more critical results need to be addressed immediately. Clinical information was updated and documented in the EMR.  Total time. I personally spent a total of 45 minutes in the care of the patient today including preparing to see the patient, getting/reviewing separately obtained history, counseling and educating, placing orders, documenting clinical information in the EHR, and independently interpreting results.   Adelita Cho, MD

## 2024-08-22 LAB — HEMOGLOBIN A1C
Est. average glucose Bld gHb Est-mCnc: 94 mg/dL
Hgb A1c MFr Bld: 4.9 % (ref 4.8–5.6)

## 2024-08-22 LAB — COMPREHENSIVE METABOLIC PANEL WITH GFR
ALT: 32 IU/L (ref 0–44)
AST: 28 IU/L (ref 0–40)
Albumin: 4.7 g/dL (ref 3.8–4.9)
Alkaline Phosphatase: 96 IU/L (ref 49–135)
BUN/Creatinine Ratio: 12 (ref 9–20)
BUN: 15 mg/dL (ref 6–24)
Bilirubin Total: 0.6 mg/dL (ref 0.0–1.2)
CO2: 21 mmol/L (ref 20–29)
Calcium: 10.2 mg/dL (ref 8.7–10.2)
Chloride: 103 mmol/L (ref 96–106)
Creatinine, Ser: 1.29 mg/dL — ABNORMAL HIGH (ref 0.76–1.27)
Globulin, Total: 2.9 g/dL (ref 1.5–4.5)
Glucose: 94 mg/dL (ref 70–99)
Potassium: 4.8 mmol/L (ref 3.5–5.2)
Sodium: 142 mmol/L (ref 134–144)
Total Protein: 7.6 g/dL (ref 6.0–8.5)
eGFR: 65 mL/min/1.73 (ref >59)

## 2024-08-22 LAB — CBC WITH DIFFERENTIAL/PLATELET
Basophils Absolute: 0 x10E3/uL (ref 0.0–0.2)
Basos: 0 %
EOS (ABSOLUTE): 0.2 x10E3/uL (ref 0.0–0.4)
Eos: 2 %
Hematocrit: 48.7 % (ref 37.5–51.0)
Hemoglobin: 15.4 g/dL (ref 13.0–17.7)
Immature Grans (Abs): 0 x10E3/uL (ref 0.0–0.1)
Immature Granulocytes: 0 %
Lymphocytes Absolute: 1.5 x10E3/uL (ref 0.7–3.1)
Lymphs: 20 %
MCH: 32.5 pg (ref 26.6–33.0)
MCHC: 31.6 g/dL (ref 31.5–35.7)
MCV: 103 fL — ABNORMAL HIGH (ref 79–97)
Monocytes Absolute: 0.9 x10E3/uL (ref 0.1–0.9)
Monocytes: 11 %
Neutrophils Absolute: 5.1 x10E3/uL (ref 1.4–7.0)
Neutrophils: 67 %
Platelets: 199 x10E3/uL (ref 150–450)
RBC: 4.74 x10E6/uL (ref 4.14–5.80)
RDW: 11.9 % (ref 11.6–15.4)
WBC: 7.7 x10E3/uL (ref 3.4–10.8)

## 2024-08-22 LAB — T3: T3, Total: 111 ng/dL (ref 71–180)

## 2024-08-22 LAB — VITAMIN B12: Vitamin B-12: 1353 pg/mL — ABNORMAL HIGH (ref 232–1245)

## 2024-08-22 LAB — LIPID PANEL WITH LDL/HDL RATIO
Cholesterol, Total: 200 mg/dL — ABNORMAL HIGH (ref 100–199)
HDL: 53 mg/dL (ref >39)
LDL Chol Calc (NIH): 133 mg/dL — ABNORMAL HIGH (ref 0–99)
LDL/HDL Ratio: 2.5 ratio (ref 0.0–3.6)
Triglycerides: 76 mg/dL (ref 0–149)
VLDL Cholesterol Cal: 14 mg/dL (ref 5–40)

## 2024-08-22 LAB — T4, FREE: Free T4: 1.4 ng/dL (ref 0.82–1.77)

## 2024-08-22 LAB — INSULIN, RANDOM: INSULIN: 14.6 u[IU]/mL (ref 2.6–24.9)

## 2024-08-22 LAB — VITAMIN D 25 HYDROXY (VIT D DEFICIENCY, FRACTURES): Vit D, 25-Hydroxy: 85.2 ng/mL (ref 30.0–100.0)

## 2024-08-22 LAB — FOLATE: Folate: 17.2 ng/mL (ref >3.0)

## 2024-08-22 LAB — TSH: TSH: 2.22 u[IU]/mL (ref 0.450–4.500)

## 2024-08-28 NOTE — Progress Notes (Signed)
 RN reached out again via e-mail to follow up with treatment decisions or additional questions.

## 2024-09-04 ENCOUNTER — Ambulatory Visit (INDEPENDENT_AMBULATORY_CARE_PROVIDER_SITE_OTHER): Admitting: Family Medicine

## 2024-09-04 ENCOUNTER — Encounter (INDEPENDENT_AMBULATORY_CARE_PROVIDER_SITE_OTHER): Payer: Self-pay | Admitting: Family Medicine

## 2024-09-04 VITALS — BP 160/88 | HR 89 | Temp 97.9°F | Ht 67.0 in | Wt 247.0 lb

## 2024-09-04 DIAGNOSIS — Z6838 Body mass index (BMI) 38.0-38.9, adult: Secondary | ICD-10-CM

## 2024-09-04 DIAGNOSIS — E88819 Insulin resistance, unspecified: Secondary | ICD-10-CM | POA: Diagnosis not present

## 2024-09-04 DIAGNOSIS — E559 Vitamin D deficiency, unspecified: Secondary | ICD-10-CM

## 2024-09-04 DIAGNOSIS — R030 Elevated blood-pressure reading, without diagnosis of hypertension: Secondary | ICD-10-CM | POA: Diagnosis not present

## 2024-09-04 DIAGNOSIS — C61 Malignant neoplasm of prostate: Secondary | ICD-10-CM | POA: Diagnosis not present

## 2024-09-04 DIAGNOSIS — E782 Mixed hyperlipidemia: Secondary | ICD-10-CM | POA: Diagnosis not present

## 2024-09-04 DIAGNOSIS — E66812 Obesity, class 2: Secondary | ICD-10-CM

## 2024-09-04 NOTE — Progress Notes (Unsigned)
 SUBJECTIVE:  Chief Complaint: Obesity  Interim History: Since last appointment patient has been trying to stick to the meal plan and stuck with it 80/85% of the time.  He is trying to get used to eating more.  Has not been hungry.  No cravings.  Some days he doesn't like to eat and so he won't much throughout the day.  He thought he had to fast today.  He is still contemplating what to choose to do for his prostate cancer.  His wife's birthday is next week and they are going to [employer].  Hasn't used the food scale much   Victor Carter is here to discuss his progress with his obesity treatment plan. He is on the Category 3 Plan and states he is following his eating plan approximately 80 % of the time. He states he is not exercising.   OBJECTIVE: Visit Diagnoses: Problem List Items Addressed This Visit   None Visit Diagnoses       Mixed hyperlipidemia    -  Primary     Insulin  resistance         Elevated blood pressure reading         Vitamin D  deficiency         Class 2 severe obesity due to excess calories with serious comorbidity and body mass index (BMI) of 38.0 to 38.9 in adult         BMI 38.0-38.9,adult           Vitals Temp: 97.9 F (36.6 C) BP: (!) 160/88 Pulse Rate: 89 SpO2: 99 %   Anthropometric Measurements Height: 5' 7 (1.702 m) Weight: 247 lb (112 kg) BMI (Calculated): 38.68 Weight at Last Visit: 246 lb Weight Lost Since Last Visit: 0 Weight Gained Since Last Visit: 1 Starting Weight: 246 lb Total Weight Loss (lbs): 0 lb (0 kg)   Body Composition  Body Fat %: 32.8 % Fat Mass (lbs): 81.2 lbs Muscle Mass (lbs): 158.4 lbs Total Body Water (lbs): 109.6 lbs Visceral Fat Rating : 20   Other Clinical Data Today's Visit #: 2 Starting Date: 08/21/24 Comments: Cat 3     ASSESSMENT AND PLAN: Assessment & Plan Mixed hyperlipidemia The 10-year ASCVD risk score (Arnett DK, et al., 2019) is: 10.5%   Values used to calculate the score:     Age:  58 years     Clincally relevant sex: Male     Is Non-Hispanic African American: Yes     Diabetic: No     Tobacco smoker: No     Systolic Blood Pressure: 160 mmHg     Is BP treated: No     HDL Cholesterol: 53 mg/dL     Total Cholesterol: 200 mg/dL Elevated 10 year ASCVd risk.  Patient blood pressure was elevated today but if numbers from last appointment used Insulin  resistance Pathophysiology of progression through insulin  resistance to prediabetes and diabetes was discussed at length today.  Patient to continue to monitor and be in control of total intake of snack calories which may be simple carbohydrates but should be consumed only after the patient has taken in all the nutrition for the day.  Macronutrient identification, classification and daily intake ratios were discussed.  Plan to repeat labs in 3 months to monitor both hemoglobin A1c and insulin  levels.  No medications at this time as patient is not having significant hunger or cravings that would make following meal plan more difficult.    Elevated blood pressure reading  Vitamin D  deficiency On 1k international units daily and patient level is at goal.  Can continue current OTC treatment.  No nausea, vomiting or muscle weakness mentioned. Class 2 severe obesity due to excess calories with serious comorbidity and body mass index (BMI) of 38.0 to 38.9 in adult  BMI 38.0-38.9,adult    Diet: Victor Carter is currently in the action stage of change. As such, his goal is to continue with weight loss efforts and has agreed to the Category 3 Plan.   Exercise:  All adults should avoid inactivity. Some activity is better than none, and adults who participate in any amount of physical activity, gain some health benefits.  Behavior Modification:  We discussed the following Behavioral Modification Strategies today: increasing lean protein intake, decreasing simple carbohydrates, increasing vegetables, meal planning and cooking strategies, and  planning for success.  Return in about 3 weeks (around 09/25/2024).   He was informed of the importance of frequent follow up visits to maximize his success with intensive lifestyle modifications for his multiple health conditions.  Attestation Statements:   Reviewed by clinician on day of visit: allergies, medications, problem list, medical history, surgical history, family history, social history, and previous encounter notes.   Adelita Cho, MD

## 2024-09-05 NOTE — Assessment & Plan Note (Signed)
 Lengthy discussion with patient concerning treatment plan options for prostate cancer.  He mentions he is giving himself 1 more week to come to a decision as to treatment plan.  He is not sure what option he is leaning most toward and voices he has concern about recurrence as well as long and short term effects.  Will follow up on what patient decides at next appointment.

## 2024-09-14 NOTE — Progress Notes (Signed)
 Patient was a Rad Onc Consult on 08/04/24 for his stage T1c adenocarcinoma of the prostate with Gleason Score of 3+4, and PSA of 8.4 and was undecided.   Patient has now confirmed he would like to proceed with prostatectomy, with umbilical hernia repair.  MD's notified.

## 2024-09-26 DIAGNOSIS — C61 Malignant neoplasm of prostate: Secondary | ICD-10-CM | POA: Diagnosis not present

## 2024-10-03 ENCOUNTER — Ambulatory Visit (INDEPENDENT_AMBULATORY_CARE_PROVIDER_SITE_OTHER): Admitting: Family Medicine

## 2024-10-03 ENCOUNTER — Encounter (INDEPENDENT_AMBULATORY_CARE_PROVIDER_SITE_OTHER): Payer: Self-pay | Admitting: Family Medicine

## 2024-10-03 VITALS — BP 148/86 | HR 87 | Temp 98.0°F | Ht 67.0 in | Wt 251.0 lb

## 2024-10-03 DIAGNOSIS — E66812 Obesity, class 2: Secondary | ICD-10-CM

## 2024-10-03 DIAGNOSIS — Z6839 Body mass index (BMI) 39.0-39.9, adult: Secondary | ICD-10-CM | POA: Diagnosis not present

## 2024-10-03 DIAGNOSIS — C61 Malignant neoplasm of prostate: Secondary | ICD-10-CM | POA: Diagnosis not present

## 2024-10-03 NOTE — Progress Notes (Signed)
   SUBJECTIVE:  Chief Complaint: Obesity  Interim History: Patient has made decision for prostatectomy.  He is awaiting on oncology to get details sorted.  Since our last appointment he has been trying to stick to the meal plan and getting more in.  He has been able to get all the food in most of the time.  Rare feelings of hunger.  Planning to give out candy for Halloween.  For Thanksgiving he will be hosting at his house and his kids will come to his house.  Patient has been eating nuts for snack calories as well as eating fruit.  Breakfast would be 2 boiled eggs, meat, fruit or a biscuit, 2 egg whites and a meat like a piece of sausage or bacon.  Lunch is often chicken, vegetables (2-3 pieces of chicken).  Occasionally may have spaghetti or lasagna.  In the afternoon he is having to force himself to eat more.  Bear is here to discuss his progress with his obesity treatment plan. He is on the Category 3 Plan and states he is following his eating plan approximately 75-80 % of the time. He states he is not exercising.   OBJECTIVE: Visit Diagnoses: Problem List Items Addressed This Visit   None   Vitals Temp: 98 F (36.7 C) BP: (!) 148/86 Pulse Rate: 87 SpO2: 98 %   Anthropometric Measurements Height: 5' 7 (1.702 m) Weight: 251 lb (113.9 kg) BMI (Calculated): 39.3 Weight at Last Visit: 247 lb Weight Lost Since Last Visit: 0 Weight Gained Since Last Visit: 4 Starting Weight: 246 lb Total Weight Loss (lbs): 0 lb (0 kg)   Body Composition  Body Fat %: 34.1 % Fat Mass (lbs): 85.8 lbs Muscle Mass (lbs): 157.4 lbs Total Body Water (lbs): 112.6 lbs Visceral Fat Rating : 21   Other Clinical Data Today's Visit #: 3 Starting Date: 08/21/24 Comments: Cat 3     ASSESSMENT AND PLAN: Assessment & Plan Malignant neoplasm of prostate [facility]) Patient has made plans for prostatectomy and is awaiting set up of operation date.  He voices he understands the possible risks of this  but is still willing to move forward given the risks of alternative treatment plan.  Follow-up at next appointment as to when operative date has been set. Class 2 severe obesity due to excess calories with serious comorbidity and body mass index (BMI) of 38.0 to 38.9 in adult  BMI 39.0-39.9,adult    Diet: Albertus is currently in the action stage of change. As such, his goal is to continue with weight loss efforts and has agreed to the Category 3 Plan; breakfast alternatives and lunch options handouts given today.   Exercise:  All adults should avoid inactivity. Some activity is better than none, and adults who participate in any amount of physical activity, gain some health benefits.  Behavior Modification:  We discussed the following Behavioral Modification Strategies today: increasing lean protein intake, decreasing simple carbohydrates, increasing vegetables, meal planning and cooking strategies, and planning for success.   No follow-ups on file.   He was informed of the importance of frequent follow up visits to maximize his success with intensive lifestyle modifications for his multiple health conditions.  Attestation Statements:   Reviewed by clinician on day of visit: allergies, medications, problem list, medical history, surgical history, family history, social history, and previous encounter notes.   Adelita Cho, MD

## 2024-10-08 NOTE — Assessment & Plan Note (Signed)
 Patient has made plans for prostatectomy and is awaiting set up of operation date.  He voices he understands the possible risks of this but is still willing to move forward given the risks of alternative treatment plan.  Follow-up at next appointment as to when operative date has been set.

## 2024-10-24 ENCOUNTER — Ambulatory Visit (INDEPENDENT_AMBULATORY_CARE_PROVIDER_SITE_OTHER): Payer: Self-pay | Admitting: Family Medicine

## 2024-10-24 ENCOUNTER — Encounter (INDEPENDENT_AMBULATORY_CARE_PROVIDER_SITE_OTHER): Payer: Self-pay | Admitting: Family Medicine

## 2024-10-24 VITALS — BP 158/94 | HR 90 | Temp 98.1°F | Ht 67.0 in | Wt 249.0 lb

## 2024-10-24 DIAGNOSIS — Z6839 Body mass index (BMI) 39.0-39.9, adult: Secondary | ICD-10-CM

## 2024-10-24 DIAGNOSIS — I10 Essential (primary) hypertension: Secondary | ICD-10-CM

## 2024-10-24 DIAGNOSIS — E66812 Obesity, class 2: Secondary | ICD-10-CM | POA: Diagnosis not present

## 2024-10-24 DIAGNOSIS — Z6838 Body mass index (BMI) 38.0-38.9, adult: Secondary | ICD-10-CM | POA: Diagnosis not present

## 2024-10-24 DIAGNOSIS — C61 Malignant neoplasm of prostate: Secondary | ICD-10-CM | POA: Diagnosis not present

## 2024-10-24 MED ORDER — CHLORTHALIDONE 25 MG PO TABS: 25.0000 mg | ORAL_TABLET | Freq: Every day | ORAL | 0 refills | Status: DC

## 2024-10-24 NOTE — Progress Notes (Signed)
 SUBJECTIVE:  Chief Complaint: Obesity  Interim History: Patient has done a bit of walking and cut back on his beer drinking.  He is trying to stay mindful of food plan. Doesn't necessarily feel better or worse with the changes he has implemented.  Hasn't scheduled his surgery date yet.  He is planning to stay local for the Thanksgiving holiday.  He is going to try to recreate a picture from when his kids were little. December tends to be more low key for him but busy for his wife.  Biggest obstacle over the next 4-5 weeks will be the cooler weather and getting out.  He has been contemplating joining [employer].  Victor Carter is here to discuss his progress with his obesity treatment plan. He is on the Category 3 Plan and states he is following his eating plan approximately 80-85 % of the time. He states he is walking 2 miles for 3 times per week.   OBJECTIVE: Visit Diagnoses: Problem List Items Addressed This Visit       Genitourinary   Malignant neoplasm of prostate (HCC)   Other Visit Diagnoses       Primary hypertension    -  Primary   Relevant Medications   chlorthalidone  (HYGROTON ) 25 MG tablet     Class 2 severe obesity due to excess calories with serious comorbidity and body mass index (BMI) of 38.0 to 38.9 in adult         BMI 39.0-39.9,adult           Vitals Temp: 98.1 F (36.7 C) BP: (!) 158/94 Pulse Rate: 90 SpO2: 96 %   Anthropometric Measurements Height: 5' 7 (1.702 m) Weight: 249 lb (112.9 kg) BMI (Calculated): 38.99 Weight at Last Visit: 251 lb Weight Lost Since Last Visit: 2 Weight Gained Since Last Visit: 0 Starting Weight: 246 lb Total Weight Loss (lbs): 0 lb (0 kg)   Body Composition  Body Fat %: 33.8 % Fat Mass (lbs): 84.4 lbs Muscle Mass (lbs): 157 lbs Total Body Water (lbs): 113.6 lbs Visceral Fat Rating : 21   Other Clinical Data Today's Visit #: 4 Starting Date: 08/21/24 Comments: Cat 3     ASSESSMENT AND PLAN: Assessment &  Plan Primary hypertension BP elevated today. We discussed medication initiation today as BP has been consistently elevated.  Patient denies symptoms of elevated blood pressure today such as headache chest pain or chest pressure.  Will start chlorthalidone  25 mg daily and follow-up on blood pressure at next appointment will need repeat metabolic panel 1 to 2 months after starting treatment to check electrolytes and kidney function. Malignant neoplasm of prostate (HCC) Has yet to schedule prostatectomy.  Will follow up after holiday season as to when patient surgery is to be scheduled. Class 2 severe obesity due to excess calories with serious comorbidity and body mass index (BMI) of 38.0 to 38.9 in adult  BMI 39.0-39.9,adult    Diet: Quan is currently in the action stage of change. As such, his goal is to continue with weight loss efforts and has agreed to the Category 3 Plan.   Exercise:  For substantial health benefits, adults should do at least 150 minutes (2 hours and 30 minutes) a week of moderate-intensity, or 75 minutes (1 hour and 15 minutes) a week of vigorous-intensity aerobic physical activity, or an equivalent combination of moderate- and vigorous-intensity aerobic activity. Aerobic activity should be performed in episodes of at least 10 minutes, and preferably, it should be spread throughout  the week.  Behavior Modification:  We discussed the following Behavioral Modification Strategies today: increasing lean protein intake, decreasing simple carbohydrates, increasing vegetables, meal planning and cooking strategies, holiday eating strategies, and planning for success.   Return in about 4 weeks (around 11/21/2024).   He was informed of the importance of frequent follow up visits to maximize his success with intensive lifestyle modifications for his multiple health conditions.  Attestation Statements:   Reviewed by clinician on day of visit: allergies, medications, problem  list, medical history, surgical history, family history, social history, and previous encounter notes.   Adelita Cho, MD

## 2024-10-25 ENCOUNTER — Encounter: Payer: Self-pay | Admitting: Nurse Practitioner

## 2024-11-02 NOTE — Assessment & Plan Note (Signed)
 Has yet to schedule prostatectomy.  Will follow up after holiday season as to when patient surgery is to be scheduled.

## 2024-11-22 ENCOUNTER — Ambulatory Visit (INDEPENDENT_AMBULATORY_CARE_PROVIDER_SITE_OTHER): Admitting: Family Medicine

## 2024-11-22 VITALS — BP 131/76 | HR 108 | Temp 97.8°F | Ht 67.0 in | Wt 247.0 lb

## 2024-11-22 DIAGNOSIS — E66812 Obesity, class 2: Secondary | ICD-10-CM | POA: Diagnosis not present

## 2024-11-22 DIAGNOSIS — R29818 Other symptoms and signs involving the nervous system: Secondary | ICD-10-CM

## 2024-11-22 DIAGNOSIS — I10 Essential (primary) hypertension: Secondary | ICD-10-CM

## 2024-11-22 DIAGNOSIS — Z6838 Body mass index (BMI) 38.0-38.9, adult: Secondary | ICD-10-CM | POA: Diagnosis not present

## 2024-11-22 MED ORDER — CHLORTHALIDONE 25 MG PO TABS: 25.0000 mg | ORAL_TABLET | Freq: Every day | ORAL | 0 refills | Status: AC

## 2024-11-22 NOTE — Progress Notes (Signed)
 SUBJECTIVE:  Chief Complaint: Obesity  Interim History: Patient reports that recently he has been waking up tired and is wondering if he has sleep apnea.  He does report improvement in his sleep since starting his sinus spray and decongestant.  He has been consistent with the meal plan with the exception of Thanksgiving.  He is getting more nutritious in and is finding he isn't hungry later in the day when it is time for dinner.  He is eating more during the day than in the evening. He is doing a trail mix snack bag for one of his in between meals snacks. He is also doing [employer] yogurt in the am with fruit and meat.   Victor Carter is here to discuss his progress with his obesity treatment plan. He is on the Category 3 Plan and states he is following his eating plan approximately 75 % of the time. He states he is walking.   OBJECTIVE: Visit Diagnoses: Problem List Items Addressed This Visit   None Visit Diagnoses       Suspected sleep apnea    -  Primary   Relevant Orders   Ambulatory referral to Neurology     Primary hypertension       Relevant Medications   chlorthalidone  (HYGROTON ) 25 MG tablet     BMI 38.0-38.9,adult         Class 2 severe obesity due to excess calories with serious comorbidity and body mass index (BMI) of 38.0 to 38.9 in adult           Vitals Temp: 97.8 F (36.6 C) BP: 131/76 Pulse Rate: (!) 108 SpO2: 95 %   Anthropometric Measurements Height: 5' 7 (1.702 m) Weight: 247 lb (112 kg) BMI (Calculated): 38.68 Weight at Last Visit: 249 lb Weight Lost Since Last Visit: 2 Weight Gained Since Last Visit: 0 Starting Weight: 246 lb Total Weight Loss (lbs): 0 lb (0 kg)   Body Composition  Body Fat %: 33.8 % Fat Mass (lbs): 83.6 lbs Muscle Mass (lbs): 155.6 lbs Total Body Water (lbs): 109 lbs Visceral Fat Rating : 21   Other Clinical Data Today's Visit #: 5 Starting Date: 08/21/24 Comments: Cat 3     ASSESSMENT AND PLAN: Assessment &  Plan Suspected sleep apnea Patient mentions he does still have feelings of being tired when waking up.  He does snore.  Wanting to get evaluated for OSA- referral to neurology for sleep study today. Primary hypertension BP controlled today.  No chest pain, chest pressure or headache.  Patient mentions he can tell a difference now that he is taking his BP meds. Needs a refill of medication today.  BMI 38.0-38.9,adult  Class 2 severe obesity due to excess calories with serious comorbidity and body mass index (BMI) of 38.0 to 38.9 in adult    Diet: Victor Carter is currently in the action stage of change. As such, his goal is to continue with weight loss efforts and has agreed to the Category 3 Plan.   Exercise:  For substantial health benefits, adults should do at least 150 minutes (2 hours and 30 minutes) a week of moderate-intensity, or 75 minutes (1 hour and 15 minutes) a week of vigorous-intensity aerobic physical activity, or an equivalent combination of moderate- and vigorous-intensity aerobic activity. Aerobic activity should be performed in episodes of at least 10 minutes, and preferably, it should be spread throughout the week.  Behavior Modification:  We discussed the following Behavioral Modification Strategies today: increasing lean protein intake,  decreasing simple carbohydrates, meal planning and cooking strategies, and planning for success.   Return in about 4 weeks (around 12/20/2024).   He was informed of the importance of frequent follow up visits to maximize his success with intensive lifestyle modifications for his multiple health conditions.  Attestation Statements:   Reviewed by clinician on day of visit: allergies, medications, problem list, medical history, surgical history, family history, social history, and previous encounter notes.  Victor Cho, MD

## 2025-01-01 ENCOUNTER — Ambulatory Visit (INDEPENDENT_AMBULATORY_CARE_PROVIDER_SITE_OTHER): Admitting: Family Medicine

## 2025-01-04 ENCOUNTER — Ambulatory Visit (INDEPENDENT_AMBULATORY_CARE_PROVIDER_SITE_OTHER): Admitting: Family Medicine

## 2025-01-11 ENCOUNTER — Encounter: Payer: Self-pay | Admitting: Neurology

## 2025-01-11 ENCOUNTER — Ambulatory Visit: Admitting: Neurology

## 2025-01-11 VITALS — BP 127/82 | HR 94 | Ht 67.0 in | Wt 258.8 lb

## 2025-01-11 DIAGNOSIS — E66813 Obesity, class 3: Secondary | ICD-10-CM | POA: Insufficient documentation

## 2025-01-11 DIAGNOSIS — G478 Other sleep disorders: Secondary | ICD-10-CM | POA: Insufficient documentation

## 2025-01-11 DIAGNOSIS — Z9189 Other specified personal risk factors, not elsewhere classified: Secondary | ICD-10-CM | POA: Insufficient documentation

## 2025-01-11 DIAGNOSIS — K219 Gastro-esophageal reflux disease without esophagitis: Secondary | ICD-10-CM

## 2025-01-11 DIAGNOSIS — R0683 Snoring: Secondary | ICD-10-CM | POA: Insufficient documentation

## 2025-01-11 DIAGNOSIS — R0602 Shortness of breath: Secondary | ICD-10-CM

## 2025-01-11 DIAGNOSIS — R0982 Postnasal drip: Secondary | ICD-10-CM

## 2025-01-11 NOTE — Progress Notes (Unsigned)
 "  @[facility]   Provider:  Dedra Gores, MD  Primary Care Physician:  Wendee Lynwood HERO, NP [address] [place] [address]  Referring Provider: Berkeley Adelita PENNER, Md [address] [place],  [place] [address]        Chief Concern for this Consultation:   Patient presents with          HPI: I have the pleasure of meeting with Victor Carter , on 01/11/25 , who is a 59 y.o.  male patient,  seen upon a  Chief concern according to patient:  non restorative sleep, mostly . Loud Snoring, witnessed apneas, restless, tossing and  turning,  wife has moved to another bedroom.       has a past medical history of Allergy (12/07/2017), Chicken pox, no shingles , GERD (gastroesophageal reflux disease) (12/07/2020), and Sickle cell anemia (HCC) trait.  He is morbidly obese: BMI 40 , gradual weight gain over a decade.   Had recently a  dental extraction, Thyroid  disease or other endocrinological disorders.  The patient had no previous sleep evaluations.   Family medical history: There are no biological family members affected by Sleep apnea ( ), or Insomnia () , by excessive daytime sleepiness (). mother is living at 18 , healthy,  father passed in 1979 of an accident.  Healthy siblings, he is the youngest.     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 The patient works in daytime   Nicotine use: none .  ETOH use:  yes, 1-max 3 beers on weekends. ,  Caffeine intake in form of: Coffee ( 1 a week ), Soft drinks (2/ week), Tea ( none) or Energy drinks ( including those containing  taurine ). Caffeine is last consumed at noon.  Exercises not regularly. Rides motorcycle,  shooting on a range. Collecting guns.     Sleep habits and routines are as follows: The  patient's dinner time is around 4-5 PM.  He watches TV and falls asleep on the couch, and sleeps for 60 minutes before he will go to bed-  The patient goes to bed at, or close to 11 PM. The bedroom is  no longer shared ,  described as cool, quiet, and dark. The patient reports that it takes 10-20 minutes to fall asleep, then continues to sleep for 5.5 hours, uninterrupted or woken up by the need to void (Nocturia) once.   The preferred sleep position is sideways, with support of 1 pillows,  adjustable bed/ 12 degrees ).  The total estimated sleep time is circa 6 hours.  Dreams are reportedly in frequent/ and can be vivid. Dream enactment has been reported.   4.30 AM is the usual week- day rise time.with an alarm  he reports mostly not feeling refreshed and restored in the morning, waking with symptoms such as dry mouth,nasal congestion   No sleep paralysis has been experienced.  Naps in daytime are taken infrequently (there is a desire to nap and weekend-opportunity), lasting from 45 to 60 minutes and have a refreshing quality. These do not interfere with nocturnal sleep.    Review of Systems: Out of a complete 14 system review, the patient complains of only the following symptoms, and all other reviewed systems are negative.:  Snoring, Sleep fragmentation, Nocturia   How likely are you to doze in the following situations: 0 = not likely, 1 = slight chance, 2 = moderate chance, 3 = high chance Sitting and Reading? Watching Television? Sitting inactive in a public place (theater or meeting)? As a passenger in a car for an hour without a break? Lying down in the afternoon when circumstances permit? Sitting and talking to someone? Sitting quietly after lunch without alcohol? In a car, while stopped for a few minutes in traffic?   Total ESS =4 / 24 points.    FSS endorsed at 18/ 63 points.  GDS:  Social History   Socioeconomic History   Marital status: Married    Spouse name: Contessa    Number of children: 0   Years of education: Not on file   Highest education level: Not on file  Occupational History   Not on file  Tobacco Use   Smoking status: Never   Smokeless tobacco: Never  Vaping Use   Vaping status: Never Used  Substance and Sexual Activity   Alcohol use: Yes    Alcohol/week: 9.0 standard drinks of alcohol    Types: 4 Cans of beer, 5 Shots of liquor per week    Comment: occasional-social   Drug use: No   Sexual activity: Yes  Other Topics Concern   Not on file  Social History Narrative   Fulltime: Supervisor [employer]      Hobbies:going to sports events, education officer, community. Restoring car and riding motorcycle   Pt lives with family    Social Drivers of Health   Tobacco Use: Low Risk (01/11/2025)   Patient History    Smoking Tobacco Use: Never    Smokeless Tobacco Use: Never    Passive Exposure: Not on file  Financial Resource Strain: Not on file  Food Insecurity: No Food Insecurity (08/04/2024)   Epic    Worried About Programme Researcher, Broadcasting/film/video in the Last Year: Never true    Ran Out of Food in the Last Year: Never true  Transportation Needs: No Transportation Needs (08/04/2024)   Epic    Lack of Transportation (Medical): No    Lack of Transportation (Non-Medical): No  Physical Activity: Not on file  Stress: Not on file  Social Connections: Not on file  Depression (PHQ2-9): Medium Risk (08/21/2024)   Depression (PHQ2-9)    PHQ-2 Score: 5  Alcohol Screen: Low Risk (08/04/2024)   Alcohol Screen    Last Alcohol Screening Score (AUDIT): 4  Housing: Low Risk (08/04/2024)   Epic    Unable to Pay for Housing in the Last Year: No    Number of Times Moved in the Last Year: 0    Homeless in the Last Year: No  Utilities: Not At Risk (08/04/2024)   Epic    Threatened with loss of utilities: No  Health Literacy: Not on file    Family History  Problem Relation Age of Onset   Cancer Mother    Breast cancer Mother    Hypertension Mother    Colon cancer  Maternal Uncle        unsure butt thinks 50   Breast cancer Maternal Grandmother    Cancer Maternal Grandmother    Arthritis Paternal Grandmother    Hypertension Paternal Grandmother    Diabetes Neg Hx    Stroke Neg Hx    Sleep apnea Neg Hx     Past Medical History:  Diagnosis Date   Allergy 12/07/2017   Chicken  pox    GERD (gastroesophageal reflux disease) 12/07/2020   Sickle cell anemia (HCC)    trait    Past Surgical History:  Procedure Laterality Date   bone graft dental surgery right side  02/08/2023   COLONOSCOPY WITH PROPOFOL  N/A 01/15/2017   Procedure: COLONOSCOPY WITH PROPOFOL ;  Surgeon: Ruel Kung, MD;  Location: [facility] ENDOSCOPY;  Service: Endoscopy;  Laterality: N/A;   COLONOSCOPY WITH PROPOFOL  N/A 03/15/2023   Procedure: COLONOSCOPY WITH PROPOFOL ;  Surgeon: Kung Ruel, MD;  Location: [facility] ENDOSCOPY;  Service: Gastroenterology;  Laterality: N/A;   LEG TENDON SURGERY Left 1986   PROSTATE BIOPSY     WISDOM TOOTH EXTRACTION       Medications Ordered Prior to Encounter[1]  Allergies[2]  Vitals:   01/11/25 1500  BP: 127/82  Pulse: 94      Physical exam:   General: The patient was alert and appears not in acute distress.  Mood and affect are appropriate .  The patient's interactions are: Cooperative, makes eye contact, follows the instructions and answers questions coherently.  The patient is groomed and appropriately groomed and dressed. Head: Normocephalic, atraumatic.  Neck is supple. Facial hair .  Mallampati: . 3 The neck circumference measured 21.5  inches. Nasal airflow was patent ,   Overbite / Retrognathia was noted.  Dental status:  Cardiovascular:  Regular rate and cardiac rhythm by palpable pulse. Respiratory: no audible wheezing, no tachypnoea.   Skin:  Without evidence of ankle edema. No discoloration.  Trunk:  BMI is 40.4.  The patient's posture was erect.   Neurologic exam : The patient was awake and alert, oriented to place and  time.   Attention span & concentration ability appeared normal.  Speech was fluent, without dysarthria, mild dysphonia , no aphasia, and of normal volume.     Cranial nerves:  There was no loss of smell or taste reported  Pupils are round, equal in size and briskly reactive to light.  Funduscopic exam was deferred.  Extraocular movements in vertical and horizontal planes were intact and without nystagmus. (No Diplopia reported). Visual fields by finger perimetry are intact. Hearing was intact to soft voice.    Facial sensation intact to fine touch.  Facial motor strength: Symmetric movement and tongue and uvula move midline.  Neck ROM: rotation, tilt and flexion extension were intact for age and shoulder shrug was symmetrical.    Motor exam:  Symmetric bulk, strength and ROM.   Normal tone without cog- wheeling, and symmetric grip strength.   Sensory:  deferred.    Coordination: without evidence of ataxia, dysmetria or tremor.   Gait and station: Patient could rise unassisted from a seated position, without bracing,    I would like to thank Wendee Lynwood HERO, NP and Berkeley Adelita PENNER, Md [address] [place],  [place] [address] for allowing me to meet with Victor Carter,   In short, Victor Carter is presenting with less restorative sleep quality than he likes- some days he struggles not to nap, and often fall as asleep in front of TV , may be for 60 minutes?  Risk factors for OSA were present,  including : Body mass index is 40.53 kg/m., neck size  20  , and upper airway anatomy.  He is enrolled in weight and wellness program. He is interested in zepbound, hoping the dx of apnea may help him.   My Plan is to proceed with:  HST/ PSG SPLIT at [facility] 20/h     I  plan to follow up personally or through our NP within 4-6 months.   A total time of  45  minutes consistent of a part of face to face encounter , exam and interview,  and additional preparation time for chart review was  spent .  At today's visit, we discussed treatment options, associated risk and benefits, and engage in counseling as needed including, but not limited to:  Sleep hygiene, Quality Sleep Habits, and Safety concerns for patients with daytime sleepiness who are warned to not operate machinery/ motor vehicles when drowsy. Risk factors for sleep apnea were identified:  Body mass index is 40.53 kg/m..  Additionally, the following were reviewed: Past medical records, past medical and surgical history, family and social background, as well as relevant laboratory results, imaging findings, and medical notes, where applicable.  This note was generated by myself in part by using dictation software, and as a result, it may contain unintentional typos and errors.  Nevertheless, effort was made to accurately convey the pertinent aspects of the patient's visit.   Dedra Gores, MD   [facility] and [facility] Sleep Board certified in Sleep Medicine by The [employer] of Sleep Medicine and Diplomate of the [employer] (AASM) . Board certified In Neurology, Diplomat of the ABPN,  Fellow of the [employer] Neu[employer].         [1]  Current Outpatient Medications on File Prior to Visit  Medication Sig Dispense Refill   chlorthalidone  (HYGROTON ) 25 MG tablet Take 1 tablet (25 mg total) by mouth daily. 90 tablet 0   cholecalciferol (VITAMIN D3) 25 MCG (1000 UNIT) tablet Take 1,000 Units by mouth daily.     fluticasone  (FLONASE ) 50 MCG/ACT nasal spray Place 2 sprays into both nostrils daily. 16 g 0   levocetirizine (XYZAL ) 5 MG tablet TAKE 1 TABLET BY MOUTH ONCE DAILY IN THE EVENING 90 tablet 2   Multiple Vitamin (MULTIVITAMIN) capsule Take 1 capsule by mouth daily.     Omega-3 Fatty Acids (FISH OIL) 1000 MG CAPS Take by mouth.     No current facility-administered medications on file prior to visit.  [2] No Known Allergies  "

## 2025-01-11 NOTE — Patient Instructions (Signed)
" °  I would like to thank Wendee Lynwood HERO, NP and Berkeley Adelita PENNER, Md [address] [place],  [place] [address] for allowing me to meet with Victor Carter,   In short, Victor Carter is presenting with less restorative sleep quality than he likes- some days he struggles not to nap, and often fall as asleep in front of TV , may be for 60 minutes?  Risk factors for OSA were present,  including : Body mass index is 40.53 kg/m., neck size  20  , and upper airway anatomy.  He is enrolled in weight and wellness program. He is interested in zepbound, hoping the dx of apnea may help him.   My Plan is to proceed with:  HST/ PSG SPLIT at [facility] 20/h     I plan to follow up personally or through our NP within 4-6 months.   A total time of  45  minutes consistent of a part of face to face encounter , exam and interview,  and additional preparation time for chart review was spent .  At today's visit, we discussed treatment options, associated risk and benefits, and engage in counseling as needed including, but not limited to:  Sleep hygiene, Quality Sleep Habits, and Safety concerns for patients with daytime sleepiness who are warned to not operate machinery/ motor vehicles when drowsy. Risk factors for sleep apnea were identified:  Body mass index is 40.53 kg/m..  Additionally, the following were reviewed: Past medical records, past medical and surgical history, family and social background, as well as relevant laboratory results, imaging findings, and medical notes, where applicable.  This note was generated by myself in part by using dictation software, and as a result, it may contain unintentional typos and errors.  Nevertheless, effort was made to accurately convey the pertinent aspects of the patient's visit.   Dedra Gores, MD   [facility] and [facility] Sleep Board certified in Sleep Medicine by The [employer] of Sleep Medicine and Diplomate of the [employer] Sleep  Medicine (AASM) . "

## 2025-01-18 ENCOUNTER — Ambulatory Visit (INDEPENDENT_AMBULATORY_CARE_PROVIDER_SITE_OTHER): Admitting: Family Medicine

## 2025-02-28 ENCOUNTER — Encounter: Admitting: Nurse Practitioner
